# Patient Record
Sex: Male | Born: 1965 | Race: White | Hispanic: No | Smoking: Current some day smoker
Health system: Southern US, Community
[De-identification: ages and names within clinical notes are randomized; demographics above are authoritative.]

## PROBLEM LIST (undated history)

## (undated) DIAGNOSIS — F1910 Other psychoactive substance abuse, uncomplicated: Secondary | ICD-10-CM

## (undated) DIAGNOSIS — F1990 Other psychoactive substance use, unspecified, uncomplicated: Secondary | ICD-10-CM

## (undated) DIAGNOSIS — F1120 Opioid dependence, uncomplicated: Secondary | ICD-10-CM

## (undated) HISTORY — PX: FRACTURE SURGERY: SHX138

---

## 1998-05-24 ENCOUNTER — Emergency Department (HOSPITAL_COMMUNITY): Admission: EM | Admit: 1998-05-24 | Discharge: 1998-05-24 | Payer: Self-pay | Admitting: Internal Medicine

## 2002-01-30 ENCOUNTER — Emergency Department (HOSPITAL_COMMUNITY): Admission: EM | Admit: 2002-01-30 | Discharge: 2002-01-30 | Payer: Self-pay | Admitting: Emergency Medicine

## 2004-07-20 ENCOUNTER — Emergency Department (HOSPITAL_COMMUNITY): Admission: EM | Admit: 2004-07-20 | Discharge: 2004-07-20 | Payer: Self-pay | Admitting: Family Medicine

## 2004-09-18 ENCOUNTER — Emergency Department (HOSPITAL_COMMUNITY): Admission: EM | Admit: 2004-09-18 | Discharge: 2004-09-18 | Payer: Self-pay | Admitting: Emergency Medicine

## 2005-03-05 ENCOUNTER — Emergency Department (HOSPITAL_COMMUNITY): Admission: EM | Admit: 2005-03-05 | Discharge: 2005-03-05 | Payer: Self-pay | Admitting: Family Medicine

## 2006-05-31 ENCOUNTER — Emergency Department (HOSPITAL_COMMUNITY): Admission: EM | Admit: 2006-05-31 | Discharge: 2006-05-31 | Payer: Self-pay | Admitting: Family Medicine

## 2006-09-13 ENCOUNTER — Emergency Department (HOSPITAL_COMMUNITY): Admission: EM | Admit: 2006-09-13 | Discharge: 2006-09-13 | Payer: Self-pay | Admitting: Family Medicine

## 2006-10-30 ENCOUNTER — Emergency Department (HOSPITAL_COMMUNITY): Admission: EM | Admit: 2006-10-30 | Discharge: 2006-10-31 | Payer: Self-pay | Admitting: Emergency Medicine

## 2006-10-31 ENCOUNTER — Emergency Department (HOSPITAL_COMMUNITY): Admission: EM | Admit: 2006-10-31 | Discharge: 2006-10-31 | Payer: Self-pay | Admitting: Family Medicine

## 2010-12-18 HISTORY — PX: ANKLE FRACTURE SURGERY: SHX122

## 2013-09-01 ENCOUNTER — Encounter: Payer: Self-pay | Admitting: Family Medicine

## 2013-09-10 ENCOUNTER — Encounter: Payer: Self-pay | Admitting: Family Medicine

## 2013-09-10 ENCOUNTER — Ambulatory Visit (INDEPENDENT_AMBULATORY_CARE_PROVIDER_SITE_OTHER): Payer: Medicaid Other

## 2013-09-10 ENCOUNTER — Ambulatory Visit (INDEPENDENT_AMBULATORY_CARE_PROVIDER_SITE_OTHER): Payer: Medicaid Other | Admitting: Family Medicine

## 2013-09-10 VITALS — BP 127/77 | HR 77 | Temp 97.2°F | Ht 69.0 in | Wt 184.0 lb

## 2013-09-10 DIAGNOSIS — M79674 Pain in right toe(s): Secondary | ICD-10-CM

## 2013-09-10 DIAGNOSIS — M25511 Pain in right shoulder: Secondary | ICD-10-CM

## 2013-09-10 DIAGNOSIS — M79644 Pain in right finger(s): Secondary | ICD-10-CM

## 2013-09-10 DIAGNOSIS — M25519 Pain in unspecified shoulder: Secondary | ICD-10-CM

## 2013-09-10 DIAGNOSIS — M79609 Pain in unspecified limb: Secondary | ICD-10-CM

## 2013-09-10 NOTE — Patient Instructions (Addendum)
Smoking Cessation Quitting smoking is important to your health and has many advantages. However, it is not always easy to quit since nicotine is a very addictive drug. Often times, people try 3 times or more before being able to quit. This document explains the best ways for you to prepare to quit smoking. Quitting takes hard work and a lot of effort, but you can do it. ADVANTAGES OF QUITTING SMOKING  You will live longer, feel better, and live better.  Your body will feel the impact of quitting smoking almost immediately.  Within 20 minutes, blood pressure decreases. Your pulse returns to its normal level.  After 8 hours, carbon monoxide levels in the blood return to normal. Your oxygen level increases.  After 24 hours, the chance of having a heart attack starts to decrease. Your breath, hair, and body stop smelling like smoke.  After 48 hours, damaged nerve endings begin to recover. Your sense of taste and smell improve.  After 72 hours, the body is virtually free of nicotine. Your bronchial tubes relax and breathing becomes easier.  After 2 to 12 weeks, lungs can hold more air. Exercise becomes easier and circulation improves.  The risk of having a heart attack, stroke, cancer, or lung disease is greatly reduced.  After 1 year, the risk of coronary heart disease is cut in half.  After 5 years, the risk of stroke falls to the same as a nonsmoker.  After 10 years, the risk of lung cancer is cut in half and the risk of other cancers decreases significantly.  After 15 years, the risk of coronary heart disease drops, usually to the level of a nonsmoker.  If you are pregnant, quitting smoking will improve your chances of having a healthy baby.  The people you live with, especially any children, will be healthier.  You will have extra money to spend on things other than cigarettes. QUESTIONS TO THINK ABOUT BEFORE ATTEMPTING TO QUIT You may want to talk about your answers with your  caregiver.  Why do you want to quit?  If you tried to quit in the past, what helped and what did not?  What will be the most difficult situations for you after you quit? How will you plan to handle them?  Who can help you through the tough times? Your family? Friends? A caregiver?  What pleasures do you get from smoking? What ways can you still get pleasure if you quit? Here are some questions to ask your caregiver:  How can you help me to be successful at quitting?  What medicine do you think would be best for me and how should I take it?  What should I do if I need more help?  What is smoking withdrawal like? How can I get information on withdrawal? GET READY  Set a quit date.  Change your environment by getting rid of all cigarettes, ashtrays, matches, and lighters in your home, car, or work. Do not let people smoke in your home.  Review your past attempts to quit. Think about what worked and what did not. GET SUPPORT AND ENCOURAGEMENT You have a better chance of being successful if you have help. You can get support in many ways.  Tell your family, friends, and co-workers that you are going to quit and need their support. Ask them not to smoke around you.  Get individual, group, or telephone counseling and support. Programs are available at local hospitals and health centers. Call your local health department for   information about programs in your area.  Spiritual beliefs and practices may help some smokers quit.  Download a "quit meter" on your computer to keep track of quit statistics, such as how long you have gone without smoking, cigarettes not smoked, and money saved.  Get a self-help book about quitting smoking and staying off of tobacco. LEARN NEW SKILLS AND BEHAVIORS  Distract yourself from urges to smoke. Talk to someone, go for a walk, or occupy your time with a task.  Change your normal routine. Take a different route to work. Drink tea instead of coffee.  Eat breakfast in a different place.  Reduce your stress. Take a hot bath, exercise, or read a book.  Plan something enjoyable to do every day. Reward yourself for not smoking.  Explore interactive web-based programs that specialize in helping you quit. GET MEDICINE AND USE IT CORRECTLY Medicines can help you stop smoking and decrease the urge to smoke. Combining medicine with the above behavioral methods and support can greatly increase your chances of successfully quitting smoking.  Nicotine replacement therapy helps deliver nicotine to your body without the negative effects and risks of smoking. Nicotine replacement therapy includes nicotine gum, lozenges, inhalers, nasal sprays, and skin patches. Some may be available over-the-counter and others require a prescription.  Antidepressant medicine helps people abstain from smoking, but how this works is unknown. This medicine is available by prescription.  Nicotinic receptor partial agonist medicine simulates the effect of nicotine in your brain. This medicine is available by prescription. Ask your caregiver for advice about which medicines to use and how to use them based on your health history. Your caregiver will tell you what side effects to look out for if you choose to be on a medicine or therapy. Carefully read the information on the package. Do not use any other product containing nicotine while using a nicotine replacement product.  RELAPSE OR DIFFICULT SITUATIONS Most relapses occur within the first 3 months after quitting. Do not be discouraged if you start smoking again. Remember, most people try several times before finally quitting. You may have symptoms of withdrawal because your body is used to nicotine. You may crave cigarettes, be irritable, feel very hungry, cough often, get headaches, or have difficulty concentrating. The withdrawal symptoms are only temporary. They are strongest when you first quit, but they will go away within  10 14 days. To reduce the chances of relapse, try to:  Avoid drinking alcohol. Drinking lowers your chances of successfully quitting.  Reduce the amount of caffeine you consume. Once you quit smoking, the amount of caffeine in your body increases and can give you symptoms, such as a rapid heartbeat, sweating, and anxiety.  Avoid smokers because they can make you want to smoke.  Do not let weight gain distract you. Many smokers will gain weight when they quit, usually less than 10 pounds. Eat a healthy diet and stay active. You can always lose the weight gained after you quit.  Find ways to improve your mood other than smoking. FOR MORE INFORMATION  www.smokefree.gov  Document Released: 11/28/2001 Document Revised: 06/04/2012 Document Reviewed: 03/14/2012 [employer] Patient Information 2014 [employer].  

## 2013-09-10 NOTE — Progress Notes (Signed)
  Subjective:    Patient ID: Christopher [name], male    DOB: 05/21/1966, 47 y.o.   MRN: [id number]  HPI This 47 y.o. male presents for evaluation of polyarthralgias.  He is having right shoulder pain  And left index finger pain, and right first toe pain.  He states he has been taking a lot of aleve and ibuprofen and it is not working.  He states he has taken hydrocodone in the past and it made him Sick.   Review of Systems C/o polyarthralgias. No chest pain, SOB, HA, dizziness, vision change, N/V, diarrhea, constipation, dysuria, urinary urgency or frequency, myalgias, arthralgias or rash.     Objective:   Physical Exam Vital signs noted  Well developed well nourished male.  HEENT - Head atraumatic Normocephalic                Eyes - PERRLA, Conjuctiva - clear Sclera- Clear EOMI Respiratory - Lungs CTA bilateral Cardiac - RRR S1 and S2 without murmur MS - TTP right should and will not left me do rom because of pain although when he was showing me his elbow he was lifting his right arm up.  TTP left index finger and TTP right first toe with halux valgus.  Xray of left index finger is normal. Xray of right first toe with chronic degenerative changes and halux valgus. Xray of right shoulder normal Prelimnary reading by Chrissie Noa Etty Isaac,FNP      Assessment & Plan:  Right shoulder pain - Plan: DG Shoulder Right  Toe pain, right - Plan: DG Toe Great Right  Finger pain, right - Plan: DG Finger Index Right  Discussed with patient that his pain is from arthritis and if not better to follow up Called in Tramdol 50mg  one to two po qid prn pain #30 no RF and medrol dose pack as directed. Explained and discussed xrays and informed patient that if not better may have to see orthopedics.  Follow up prn.

## 2014-12-09 ENCOUNTER — Encounter (HOSPITAL_COMMUNITY): Payer: Self-pay | Admitting: *Deleted

## 2014-12-09 ENCOUNTER — Inpatient Hospital Stay (HOSPITAL_COMMUNITY)
Admission: EM | Admit: 2014-12-09 | Discharge: 2014-12-15 | DRG: 854 | Disposition: A | Discharge status: Home / Self Care | Payer: Medicaid Other | Attending: Internal Medicine | Admitting: Internal Medicine

## 2014-12-09 DIAGNOSIS — F17210 Nicotine dependence, cigarettes, uncomplicated: Secondary | ICD-10-CM | POA: Diagnosis present

## 2014-12-09 DIAGNOSIS — R739 Hyperglycemia, unspecified: Secondary | ICD-10-CM | POA: Diagnosis present

## 2014-12-09 DIAGNOSIS — B999 Unspecified infectious disease: Secondary | ICD-10-CM

## 2014-12-09 DIAGNOSIS — B9689 Other specified bacterial agents as the cause of diseases classified elsewhere: Secondary | ICD-10-CM | POA: Diagnosis present

## 2014-12-09 DIAGNOSIS — F1990 Other psychoactive substance use, unspecified, uncomplicated: Secondary | ICD-10-CM | POA: Diagnosis present

## 2014-12-09 DIAGNOSIS — M795 Residual foreign body in soft tissue: Secondary | ICD-10-CM

## 2014-12-09 DIAGNOSIS — M60009 Infective myositis, unspecified site: Secondary | ICD-10-CM | POA: Diagnosis present

## 2014-12-09 DIAGNOSIS — M60000 Infective myositis, unspecified right arm: Secondary | ICD-10-CM | POA: Diagnosis present

## 2014-12-09 DIAGNOSIS — E871 Hypo-osmolality and hyponatremia: Secondary | ICD-10-CM | POA: Diagnosis present

## 2014-12-09 DIAGNOSIS — F1910 Other psychoactive substance abuse, uncomplicated: Secondary | ICD-10-CM

## 2014-12-09 DIAGNOSIS — L03113 Cellulitis of right upper limb: Secondary | ICD-10-CM | POA: Diagnosis present

## 2014-12-09 DIAGNOSIS — A419 Sepsis, unspecified organism: Principal | ICD-10-CM | POA: Diagnosis present

## 2014-12-09 DIAGNOSIS — L02413 Cutaneous abscess of right upper limb: Secondary | ICD-10-CM | POA: Diagnosis present

## 2014-12-09 HISTORY — DX: Other psychoactive substance use, unspecified, uncomplicated: F19.90

## 2014-12-09 LAB — COMPREHENSIVE METABOLIC PANEL
ALBUMIN: 3.4 g/dL — AB (ref 3.5–5.2)
ALT: 40 U/L (ref 0–53)
ANION GAP: 10 (ref 5–15)
AST: 41 U/L — AB (ref 0–37)
Alkaline Phosphatase: 113 U/L (ref 39–117)
BILIRUBIN TOTAL: 0.8 mg/dL (ref 0.3–1.2)
BUN: 10 mg/dL (ref 6–23)
CHLORIDE: 97 meq/L (ref 96–112)
CO2: 24 mmol/L (ref 19–32)
Calcium: 8.9 mg/dL (ref 8.4–10.5)
Creatinine, Ser: 1.06 mg/dL (ref 0.50–1.35)
GFR calc non Af Amer: 82 mL/min — ABNORMAL LOW (ref >90)
Glucose, Bld: 156 mg/dL — ABNORMAL HIGH (ref 70–99)
Potassium: 4.2 mmol/L (ref 3.5–5.1)
SODIUM: 131 mmol/L — AB (ref 135–145)
Total Protein: 7.2 g/dL (ref 6.0–8.3)

## 2014-12-09 LAB — CBC WITH DIFFERENTIAL/PLATELET
Basophils Absolute: 0 10*3/uL (ref 0.0–0.1)
Basophils Relative: 0 % (ref 0–1)
EOS PCT: 0 % (ref 0–5)
Eosinophils Absolute: 0 10*3/uL (ref 0.0–0.7)
HEMATOCRIT: 43.1 % (ref 39.0–52.0)
Hemoglobin: 14.5 g/dL (ref 13.0–17.0)
LYMPHS ABS: 1.6 10*3/uL (ref 0.7–4.0)
LYMPHS PCT: 7 % — AB (ref 12–46)
MCH: 29.8 pg (ref 26.0–34.0)
MCHC: 33.6 g/dL (ref 30.0–36.0)
MCV: 88.7 fL (ref 78.0–100.0)
MONOS PCT: 7 % (ref 3–12)
Monocytes Absolute: 1.4 10*3/uL — ABNORMAL HIGH (ref 0.1–1.0)
NEUTROS PCT: 86 % — AB (ref 43–77)
Neutro Abs: 18.4 10*3/uL — ABNORMAL HIGH (ref 1.7–7.7)
Platelets: 312 10*3/uL (ref 150–400)
RBC: 4.86 MIL/uL (ref 4.22–5.81)
RDW: 13.4 % (ref 11.5–15.5)
WBC: 21.5 10*3/uL — ABNORMAL HIGH (ref 4.0–10.5)

## 2014-12-09 LAB — I-STAT CG4 LACTIC ACID, ED: LACTIC ACID, VENOUS: 1.88 mmol/L (ref 0.5–2.2)

## 2014-12-09 LAB — SEDIMENTATION RATE: SED RATE: 15 mm/h (ref 0–16)

## 2014-12-09 MED ORDER — PIPERACILLIN-TAZOBACTAM 3.375 G IVPB 30 MIN
3.3750 g | Freq: Once | INTRAVENOUS | Status: AC
  Administered 2014-12-10: 3.375 g via INTRAVENOUS
  Filled 2014-12-09: qty 50

## 2014-12-09 MED ORDER — ONDANSETRON HCL 4 MG/2ML IJ SOLN
4.0000 mg | Freq: Once | INTRAMUSCULAR | Status: AC
  Administered 2014-12-09: 4 mg via INTRAVENOUS
  Filled 2014-12-09: qty 2

## 2014-12-09 MED ORDER — VANCOMYCIN HCL IN DEXTROSE 1-5 GM/200ML-% IV SOLN
1000.0000 mg | Freq: Once | INTRAVENOUS | Status: AC
  Administered 2014-12-09: 1000 mg via INTRAVENOUS
  Filled 2014-12-09: qty 200

## 2014-12-09 MED ORDER — HYDROMORPHONE HCL 1 MG/ML IJ SOLN
1.0000 mg | Freq: Once | INTRAMUSCULAR | Status: AC
  Administered 2014-12-09: 1 mg via INTRAVENOUS
  Filled 2014-12-09: qty 1
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
## 2014-12-10 ENCOUNTER — Inpatient Hospital Stay (HOSPITAL_COMMUNITY): Payer: Medicaid Other | Admitting: Anesthesiology

## 2014-12-10 ENCOUNTER — Encounter (HOSPITAL_COMMUNITY)
Admission: EM | Disposition: A | Discharge status: Home / Self Care | Payer: Self-pay | Source: Home / Self Care | Attending: Internal Medicine

## 2014-12-10 ENCOUNTER — Emergency Department (HOSPITAL_COMMUNITY): Payer: Medicaid Other

## 2014-12-10 ENCOUNTER — Encounter (HOSPITAL_COMMUNITY): Payer: Self-pay | Admitting: Anesthesiology

## 2014-12-10 ENCOUNTER — Other Ambulatory Visit: Payer: Self-pay | Admitting: Orthopedic Surgery

## 2014-12-10 DIAGNOSIS — L02413 Cutaneous abscess of right upper limb: Secondary | ICD-10-CM | POA: Diagnosis present

## 2014-12-10 DIAGNOSIS — R739 Hyperglycemia, unspecified: Secondary | ICD-10-CM | POA: Diagnosis present

## 2014-12-10 DIAGNOSIS — A419 Sepsis, unspecified organism: Secondary | ICD-10-CM | POA: Diagnosis present

## 2014-12-10 DIAGNOSIS — M60000 Infective myositis, unspecified right arm: Secondary | ICD-10-CM | POA: Diagnosis present

## 2014-12-10 DIAGNOSIS — E871 Hypo-osmolality and hyponatremia: Secondary | ICD-10-CM | POA: Diagnosis present

## 2014-12-10 DIAGNOSIS — L03113 Cellulitis of right upper limb: Secondary | ICD-10-CM | POA: Diagnosis present

## 2014-12-10 DIAGNOSIS — F17210 Nicotine dependence, cigarettes, uncomplicated: Secondary | ICD-10-CM | POA: Diagnosis present

## 2014-12-10 DIAGNOSIS — B9689 Other specified bacterial agents as the cause of diseases classified elsewhere: Secondary | ICD-10-CM | POA: Diagnosis present

## 2014-12-10 DIAGNOSIS — F1990 Other psychoactive substance use, unspecified, uncomplicated: Secondary | ICD-10-CM | POA: Diagnosis present

## 2014-12-10 DIAGNOSIS — M79602 Pain in left arm: Secondary | ICD-10-CM | POA: Diagnosis present

## 2014-12-10 HISTORY — PX: INCISION AND DRAINAGE ABSCESS: SHX5864

## 2014-12-10 HISTORY — PX: I&D EXTREMITY: SHX5045

## 2014-12-10 LAB — HEMOGLOBIN A1C
HEMOGLOBIN A1C: 5.5 % (ref <5.7)
Mean Plasma Glucose: 111 mg/dL (ref <117)

## 2014-12-10 LAB — SURGICAL PCR SCREEN
MRSA, PCR: NEGATIVE
STAPHYLOCOCCUS AUREUS: NEGATIVE

## 2014-12-10 LAB — GRAM STAIN

## 2014-12-10 LAB — GLUCOSE, CAPILLARY
GLUCOSE-CAPILLARY: 103 mg/dL — AB (ref 70–99)
GLUCOSE-CAPILLARY: 123 mg/dL — AB (ref 70–99)
Glucose-Capillary: 131 mg/dL — ABNORMAL HIGH (ref 70–99)
Glucose-Capillary: 119 mg/dL — ABNORMAL HIGH (ref 70–99)

## 2014-12-10 LAB — CBC
HCT: 40.5 % (ref 39.0–52.0)
HEMOGLOBIN: 13.5 g/dL (ref 13.0–17.0)
MCH: 29.5 pg (ref 26.0–34.0)
MCHC: 33.3 g/dL (ref 30.0–36.0)
MCV: 88.6 fL (ref 78.0–100.0)
PLATELETS: 288 10*3/uL (ref 150–400)
RBC: 4.57 MIL/uL (ref 4.22–5.81)
RDW: 13.2 % (ref 11.5–15.5)
WBC: 20.3 10*3/uL — ABNORMAL HIGH (ref 4.0–10.5)

## 2014-12-10 LAB — CREATININE, SERUM
Creatinine, Ser: 1 mg/dL (ref 0.50–1.35)
GFR calc Af Amer: >90 mL/min (ref >90)
GFR, EST NON AFRICAN AMERICAN: 88 mL/min — AB (ref >90)

## 2014-12-10 SURGERY — IRRIGATION AND DEBRIDEMENT EXTREMITY
Anesthesia: General | Site: Arm Lower | Laterality: Right

## 2014-12-10 MED ORDER — PIPERACILLIN-TAZOBACTAM 3.375 G IVPB
3.3750 g | Freq: Three times a day (TID) | INTRAVENOUS | Status: DC
  Administered 2014-12-10 – 2014-12-15 (×15): 3.375 g via INTRAVENOUS
  Filled 2014-12-10 (×17): qty 50

## 2014-12-10 MED ORDER — HYDROMORPHONE HCL 1 MG/ML IJ SOLN
0.5000 mg | Freq: Once | INTRAMUSCULAR | Status: AC
  Administered 2014-12-10: 0.5 mg via INTRAVENOUS

## 2014-12-10 MED ORDER — HYDROMORPHONE HCL 1 MG/ML IJ SOLN
1.0000 mg | Freq: Once | INTRAMUSCULAR | Status: AC
  Administered 2014-12-10: 1 mg via INTRAVENOUS
  Filled 2014-12-10: qty 1

## 2014-12-10 MED ORDER — FENTANYL CITRATE 0.05 MG/ML IJ SOLN
INTRAMUSCULAR | Status: AC
  Filled 2014-12-10: qty 5

## 2014-12-10 MED ORDER — HYDROMORPHONE HCL 1 MG/ML IJ SOLN
0.2500 mg | INTRAMUSCULAR | Status: DC | PRN
  Administered 2014-12-10 – 2014-12-12 (×6): 0.5 mg via INTRAVENOUS
  Filled 2014-12-10: qty 1

## 2014-12-10 MED ORDER — PIPERACILLIN-TAZOBACTAM 3.375 G IVPB 30 MIN
3.3750 g | Freq: Once | INTRAVENOUS | Status: DC
  Filled 2014-12-10: qty 50

## 2014-12-10 MED ORDER — VANCOMYCIN HCL IN DEXTROSE 1-5 GM/200ML-% IV SOLN
1000.0000 mg | Freq: Once | INTRAVENOUS | Status: DC
  Filled 2014-12-10: qty 200

## 2014-12-10 MED ORDER — ONDANSETRON HCL 4 MG PO TABS: 4.0000 mg | ORAL_TABLET | Freq: Four times a day (QID) | ORAL | Status: DC | PRN

## 2014-12-10 MED ORDER — HEPARIN SODIUM (PORCINE) 5000 UNIT/ML IJ SOLN
5000.0000 [IU] | Freq: Three times a day (TID) | INTRAMUSCULAR | Status: DC
  Administered 2014-12-12 – 2014-12-15 (×9): 5000 [IU] via SUBCUTANEOUS
  Filled 2014-12-10 (×16): qty 1

## 2014-12-10 MED ORDER — SODIUM CHLORIDE 0.9 % IJ SOLN
3.0000 mL | Freq: Two times a day (BID) | INTRAMUSCULAR | Status: DC
  Administered 2014-12-11 – 2014-12-15 (×3): 3 mL via INTRAVENOUS

## 2014-12-10 MED ORDER — ONDANSETRON HCL 4 MG/2ML IJ SOLN
INTRAMUSCULAR | Status: AC
  Filled 2014-12-10: qty 4

## 2014-12-10 MED ORDER — SODIUM CHLORIDE 0.9 % IV SOLN
250.0000 mL | INTRAVENOUS | Status: DC | PRN
  Administered 2014-12-12: 250 mL via INTRAVENOUS

## 2014-12-10 MED ORDER — LORAZEPAM 2 MG/ML IJ SOLN
1.0000 mg | Freq: Once | INTRAMUSCULAR | Status: AC
  Administered 2014-12-10: 1 mg via INTRAVENOUS
  Filled 2014-12-10: qty 1

## 2014-12-10 MED ORDER — HYDROMORPHONE HCL 1 MG/ML IJ SOLN
INTRAMUSCULAR | Status: AC
  Filled 2014-12-10: qty 1

## 2014-12-10 MED ORDER — LACTATED RINGERS IV SOLN
INTRAVENOUS | Status: DC
  Administered 2014-12-10: 13:00:00 via INTRAVENOUS

## 2014-12-10 MED ORDER — VANCOMYCIN HCL IN DEXTROSE 1-5 GM/200ML-% IV SOLN
1000.0000 mg | Freq: Two times a day (BID) | INTRAVENOUS | Status: DC
  Filled 2014-12-10 (×2): qty 200

## 2014-12-10 MED ORDER — CHLORHEXIDINE GLUCONATE 4 % EX LIQD
60.0000 mL | Freq: Once | CUTANEOUS | Status: AC
  Administered 2014-12-10: 4 via TOPICAL
  Filled 2014-12-10 (×2): qty 60

## 2014-12-10 MED ORDER — VANCOMYCIN HCL IN DEXTROSE 1-5 GM/200ML-% IV SOLN
1000.0000 mg | Freq: Two times a day (BID) | INTRAVENOUS | Status: DC
  Administered 2014-12-10 – 2014-12-15 (×9): 1000 mg via INTRAVENOUS
  Filled 2014-12-10 (×12): qty 200

## 2014-12-10 MED ORDER — ONDANSETRON HCL 4 MG/2ML IJ SOLN: 4.0000 mg | Freq: Once | INTRAMUSCULAR | Status: DC | PRN

## 2014-12-10 MED ORDER — LIDOCAINE HCL (CARDIAC) 20 MG/ML IV SOLN
INTRAVENOUS | Status: DC | PRN
  Administered 2014-12-10: 100 mg via INTRAVENOUS

## 2014-12-10 MED ORDER — LORAZEPAM 1 MG PO TABS
1.0000 mg | ORAL_TABLET | Freq: Once | ORAL | Status: AC
  Administered 2014-12-10: 1 mg via ORAL
  Filled 2014-12-10: qty 1

## 2014-12-10 MED ORDER — HYDROMORPHONE HCL 1 MG/ML IJ SOLN: 1.0000 mg | INTRAMUSCULAR | Status: DC | PRN

## 2014-12-10 MED ORDER — ONDANSETRON HCL 4 MG/2ML IJ SOLN: 4.0000 mg | Freq: Four times a day (QID) | INTRAMUSCULAR | Status: DC | PRN

## 2014-12-10 MED ORDER — SODIUM CHLORIDE 0.9 % IJ SOLN: 3.0000 mL | INTRAMUSCULAR | Status: DC | PRN

## 2014-12-10 MED ORDER — SODIUM CHLORIDE 0.9 % IR SOLN
Status: DC | PRN
  Administered 2014-12-10: 1000 mL

## 2014-12-10 MED ORDER — ONDANSETRON HCL 4 MG/2ML IJ SOLN
INTRAMUSCULAR | Status: DC | PRN
  Administered 2014-12-10: 4 mg via INTRAVENOUS

## 2014-12-10 MED ORDER — SODIUM CHLORIDE 0.9 % IR SOLN
Status: DC | PRN
  Administered 2014-12-10: 3000 mL

## 2014-12-10 MED ORDER — INSULIN ASPART 100 UNIT/ML ~~LOC~~ SOLN
0.0000 [IU] | Freq: Three times a day (TID) | SUBCUTANEOUS | Status: DC
  Administered 2014-12-10 – 2014-12-13 (×5): 1 [IU] via SUBCUTANEOUS

## 2014-12-10 MED ORDER — OXYCODONE HCL 5 MG PO TABS
5.0000 mg | ORAL_TABLET | ORAL | Status: DC | PRN
  Administered 2014-12-10 (×2): 5 mg via ORAL
  Filled 2014-12-10: qty 1

## 2014-12-10 MED ORDER — LACTATED RINGERS IV SOLN
INTRAVENOUS | Status: DC | PRN
  Administered 2014-12-10: 13:00:00 via INTRAVENOUS

## 2014-12-10 MED ORDER — PROPOFOL 10 MG/ML IV BOLUS
INTRAVENOUS | Status: DC | PRN
  Administered 2014-12-10: 200 mg via INTRAVENOUS

## 2014-12-10 MED ORDER — OXYCODONE HCL 5 MG PO TABS
ORAL_TABLET | ORAL | Status: AC
  Filled 2014-12-10: qty 1

## 2014-12-10 MED ORDER — MIDAZOLAM HCL 5 MG/5ML IJ SOLN
INTRAMUSCULAR | Status: DC | PRN
  Administered 2014-12-10: 2 mg via INTRAVENOUS

## 2014-12-10 MED ORDER — ACETAMINOPHEN 650 MG RE SUPP: 650.0000 mg | Freq: Four times a day (QID) | RECTAL | Status: DC | PRN

## 2014-12-10 MED ORDER — MIDAZOLAM HCL 2 MG/2ML IJ SOLN
INTRAMUSCULAR | Status: AC
  Filled 2014-12-10: qty 2

## 2014-12-10 MED ORDER — ACETAMINOPHEN 325 MG PO TABS
650.0000 mg | ORAL_TABLET | Freq: Four times a day (QID) | ORAL | Status: DC | PRN
Start: 2014-12-10 — End: 2014-12-15
  Administered 2014-12-10 – 2014-12-11 (×2): 650 mg via ORAL
  Filled 2014-12-10 (×2): qty 2

## 2014-12-10 MED ORDER — PIPERACILLIN-TAZOBACTAM 3.375 G IVPB
3.3750 g | Freq: Three times a day (TID) | INTRAVENOUS | Status: DC
Start: 2014-12-10 — End: 2014-12-10
  Filled 2014-12-10 (×3): qty 50

## 2014-12-10 MED ORDER — SODIUM CHLORIDE 0.9 % IV SOLN
INTRAVENOUS | Status: AC
  Administered 2014-12-10: 10:00:00 via INTRAVENOUS

## 2014-12-10 MED ORDER — OXYCODONE HCL 5 MG PO TABS
5.0000 mg | ORAL_TABLET | ORAL | Status: DC | PRN
  Administered 2014-12-11 – 2014-12-15 (×22): 10 mg via ORAL
  Filled 2014-12-10 (×23): qty 2

## 2014-12-10 MED ORDER — FENTANYL CITRATE 0.05 MG/ML IJ SOLN
INTRAMUSCULAR | Status: DC | PRN
  Administered 2014-12-10: 150 ug via INTRAVENOUS
  Administered 2014-12-10 (×2): 100 ug via INTRAVENOUS

## 2014-12-10 MED ORDER — IBUPROFEN 400 MG PO TABS
400.0000 mg | ORAL_TABLET | Freq: Four times a day (QID) | ORAL | Status: DC | PRN
  Administered 2014-12-10 – 2014-12-14 (×7): 400 mg via ORAL
  Filled 2014-12-10 (×10): qty 1

## 2014-12-10 SURGICAL SUPPLY — 51 items
BANDAGE ELASTIC 3 VELCRO ST LF (GAUZE/BANDAGES/DRESSINGS) IMPLANT
BANDAGE ELASTIC 4 VELCRO ST LF (GAUZE/BANDAGES/DRESSINGS) ×6 IMPLANT
BNDG CMPR 9X4 STRL LF SNTH (GAUZE/BANDAGES/DRESSINGS)
BNDG CONFORM 2 STRL LF (GAUZE/BANDAGES/DRESSINGS) IMPLANT
BNDG ESMARK 4X9 LF (GAUZE/BANDAGES/DRESSINGS) IMPLANT
BNDG GAUZE ELAST 4 BULKY (GAUZE/BANDAGES/DRESSINGS) ×3 IMPLANT
CORDS BIPOLAR (ELECTRODE) ×3 IMPLANT
COVER SURGICAL LIGHT HANDLE (MISCELLANEOUS) ×3 IMPLANT
CUFF TOURNIQUET SINGLE 18IN (TOURNIQUET CUFF) ×3 IMPLANT
DECANTER SPIKE VIAL GLASS SM (MISCELLANEOUS) IMPLANT
DRAIN PENROSE 1/4X12 LTX STRL (WOUND CARE) IMPLANT
DRAPE SURG 17X23 STRL (DRAPES) ×3 IMPLANT
DRSG PAD ABDOMINAL 8X10 ST (GAUZE/BANDAGES/DRESSINGS) ×3 IMPLANT
DURAPREP 26ML APPLICATOR (WOUND CARE) ×3 IMPLANT
ELECT REM PT RETURN 9FT ADLT (ELECTROSURGICAL) ×3
ELECTRODE REM PT RTRN 9FT ADLT (ELECTROSURGICAL) ×1 IMPLANT
GAUZE PACKING IODOFORM 1/4X5 (PACKING) IMPLANT
GAUZE SPONGE 4X4 12PLY STRL (GAUZE/BANDAGES/DRESSINGS) ×6 IMPLANT
GAUZE XEROFORM 1X8 LF (GAUZE/BANDAGES/DRESSINGS) IMPLANT
GAUZE XEROFORM 5X9 LF (GAUZE/BANDAGES/DRESSINGS) ×3 IMPLANT
GLOVE SURG SYN 8.0 (GLOVE) ×3 IMPLANT
GOWN STRL REUS W/ TWL LRG LVL3 (GOWN DISPOSABLE) ×1 IMPLANT
GOWN STRL REUS W/ TWL XL LVL3 (GOWN DISPOSABLE) ×1 IMPLANT
GOWN STRL REUS W/TWL LRG LVL3 (GOWN DISPOSABLE) ×3
GOWN STRL REUS W/TWL XL LVL3 (GOWN DISPOSABLE) ×3
HANDPIECE INTERPULSE COAX TIP (DISPOSABLE)
KIT BASIN OR (CUSTOM PROCEDURE TRAY) ×3 IMPLANT
KIT ROOM TURNOVER OR (KITS) ×3 IMPLANT
MANIFOLD NEPTUNE II (INSTRUMENTS) ×3 IMPLANT
NEEDLE HYPO 25GX1X1/2 BEV (NEEDLE) IMPLANT
NEEDLE HYPO 25X1 1.5 SAFETY (NEEDLE) ×3 IMPLANT
NS IRRIG 1000ML POUR BTL (IV SOLUTION) ×3 IMPLANT
PACK ORTHO EXTREMITY (CUSTOM PROCEDURE TRAY) ×3 IMPLANT
PAD ARMBOARD 7.5X6 YLW CONV (MISCELLANEOUS) ×3 IMPLANT
PAD CAST 4YDX4 CTTN HI CHSV (CAST SUPPLIES) ×1 IMPLANT
PADDING CAST COTTON 4X4 STRL (CAST SUPPLIES) ×3
SET HNDPC FAN SPRY TIP SCT (DISPOSABLE) IMPLANT
SPONGE LAP 18X18 X RAY DECT (DISPOSABLE) ×3 IMPLANT
SUCTION FRAZIER TIP 10 FR DISP (SUCTIONS) ×3 IMPLANT
SUT VICRYL RAPIDE 4/0 PS 2 (SUTURE) IMPLANT
SYR 20CC LL (SYRINGE) IMPLANT
SYR CONTROL 10ML LL (SYRINGE) IMPLANT
SYRINGE 20CC LL (MISCELLANEOUS) ×3 IMPLANT
TOWEL OR 17X24 6PK STRL BLUE (TOWEL DISPOSABLE) ×3 IMPLANT
TOWEL OR 17X26 10 PK STRL BLUE (TOWEL DISPOSABLE) ×3 IMPLANT
TUBE ANAEROBIC SPECIMEN COL (MISCELLANEOUS) ×3 IMPLANT
TUBE CONNECTING 12'X1/4 (SUCTIONS) ×1
TUBE CONNECTING 12X1/4 (SUCTIONS) ×2 IMPLANT
UNDERPAD 30X30 INCONTINENT (UNDERPADS AND DIAPERS) ×3 IMPLANT
WATER STERILE IRR 1000ML POUR (IV SOLUTION) IMPLANT
YANKAUER SUCT BULB TIP NO VENT (SUCTIONS) ×3 IMPLANT

## 2014-12-10 NOTE — ED Notes (Signed)
MRI aware patient will be medicated for next exam; 2 patients are ahead of patient; MD aware

## 2014-12-10 NOTE — Op Note (Signed)
[name]:  Christopher [name], Christopher [name]            ACCOUNT NO.:  [id number]  MEDICAL RECORD NO.:  [id number]  LOCATION:  [id number]                        FACILITY:  [facility]  PHYSICIAN:  Artist PaisMatthew A. Tyus Kallam, M.D.DATE OF BIRTH:  14-Oct-1966  DATE OF PROCEDURE:  12/10/2014 DATE OF DISCHARGE:                              OPERATIVE REPORT   PREOPERATIVE DIAGNOSIS:  Proximal forearm dorsal deep abscess.  POSTOPERATIVE DIAGNOSIS:  Proximal forearm dorsal deep abscess.  PROCEDURE:  Incision and drainage of the above.  SURGEON:  Artist PaisMatthew A. Mina MarbleWeingold, M.D.  ASSISTANT:  Betha LoaKevin Kuzma, M.D.  ANESTHESIA:  General.  COMPLICATIONS:  No complications.  DRAINS:  None.  SPECIMENS:  Wound packed, open cultures sent.  Gram stain sent.  DESCRIPTION OF PROCEDURE:  The patient was taken to the operating suite. After induction of adequate general anesthetic, right upper extremity was prepped and draped in usual sterile fashion.  An Esmarch was used to exsanguinate the limb.  Tourniquet was inflated to 250 mmHg.  At this point in time, incision was made over the dorsal radial aspect of the right forearm starting in the area of the epicondyle laterally going to the mid forearm level.  We incised the skin and the fascia overlying the interval between brachioradialis.  Extensor origin was incised. Purulence was encountered.  Culture for aerobic, anaerobic, and Gram stain.  We retracted the common extensor musculature and the brachioradialis, all the purulence was removed.  We then irrigated with 3 L of normal saline using cystoscopy tubing and then packed the wound open with 0.5 inch Iodoform gauze and Xeroform 4x4s and a compression wrap.  The patient tolerated the procedure well __________ fashion.     Artist PaisMatthew A. Mina MarbleWeingold, M.D.     MAW/MEDQ  D:  12/10/2014  T:  12/10/2014  Job:  [id number]

## 2014-12-10 NOTE — ED Notes (Signed)
Called to MRI for pt being unable to tolerate test. Given dilaudid, per dr Norlene Campbellotter order. On assessment of pt in MRI, pt lethargic but will open eyes.

## 2014-12-10 NOTE — H&P (Signed)
[facility] History and Physical  Christopher BussingRichard K [name] RUE:[id number] DOB: 11/01/1966 DOA: 12/09/2014  Referring physician: Dr. Romeo AppleHarrison PCP: Rudi HeapMOORE, [name], MD   Chief Complaint:  Right arm pain  HPI: Christopher BussingRichard K [name] is a 48 y.o. male  With recent history of IV drug use 2 days ago. Presents with pain at injection site. Since onset 2 days ago has progressively gotten worse. Nothing he is aware of makes it better. Movement or touching the site makes it more painful. The problem has been persistent since onset.   While in the ED patient had MRI of right arm which revealed abscess and cellulitis. Orthopedic hand specialist consulted. And we were consulted for further medical evaluation recommendations.   Review of Systems:  Constitutional:  No reported weight loss, night sweats, Fevers, chills, fatigue.  HEENT:  No headaches, Difficulty swallowing,Tooth/dental problems,Sore throat,  No sneezing, itching, ear ache, nasal congestion, post nasal drip,  Cardio-vascular:  No chest pain, Orthopnea, PND, swelling in lower extremities, anasarca, dizziness, palpitations  GI:  No heartburn, indigestion, abdominal pain, nausea, vomiting, diarrhea, change in bowel habits, loss of appetite  Resp:  No shortness of breath with exertion or at rest. No excess mucus, no productive cough, No non-productive cough, No coughing up of blood.No change in color of mucus.No wheezing.No chest wall deformity  Skin:  no rash or lesions.  GU:  no dysuria, change in color of urine, no urgency or frequency. No flank pain.  Musculoskeletal:  + joint pain or swelling.+ decreased range of motion. No back pain.  Psych:  No change in mood or affect. No depression or anxiety. No memory loss.   History reviewed. No pertinent past medical history. Past Surgical History  Procedure Laterality Date  . Fracture surgery Left 2012    ankle/foot   Social History:  reports that he has been smoking Cigarettes.  He has  been smoking about 0.00 packs per day. He does not have any smokeless tobacco history on file. He reports that he drinks alcohol. He reports that he does not use illicit drugs.  No Known Allergies  Family History  Problem Relation Age of Onset  . Liver disease Mother   . Heart disease Father      Prior to Admission medications   Medication Sig Start Date End Date Taking? Authorizing Provider  ibuprofen (ADVIL,MOTRIN) 200 MG tablet Take 400 mg by mouth every 6 (six) hours as needed for moderate pain.   Yes Historical Provider, MD   Physical Exam: Filed Vitals:   12/10/14 0800 12/10/14 0830 12/10/14 0900 12/10/14 0930  BP: 128/77 137/75 101/74 129/82  Pulse: 107 100 98 105  Temp:      TempSrc:      Resp:      SpO2: 96% 98% 98% 97%    Wt Readings from Last 3 Encounters:  12/10/14 83.462 kg (184 lb)  09/10/13 83.462 kg (184 lb)    General:  Appears calm and uncomfortable Eyes: PERRL, normal lids, irises & conjunctiva ENT: grossly normal hearing, lips & tongue Neck: no LAD, masses or thyromegaly Cardiovascular: RRR, no m/r/g. No LE edema. Respiratory: CTA bilaterally, no w/r/r. Normal respiratory effort. Abdomen: soft, nt,nd Skin: Right arm induration and cellulitis. Pain on palpation Musculoskeletal: Decreased range of motion in right arm. Cyanosis or clubbing Psychiatric: grossly normal mood and flat affect, speech fluent and appropriate Neurologic: Answers questions appropriately, no facial asymmetry          Labs on Admission:  Basic Metabolic Panel:  Recent Labs Lab 12/09/14 2218  NA 131*  K 4.2  CL 97  CO2 24  GLUCOSE 156*  BUN 10  CREATININE 1.06  CALCIUM 8.9   Liver Function Tests:  Recent Labs Lab 12/09/14 2218  AST 41*  ALT 40  ALKPHOS 113  BILITOT 0.8  PROT 7.2  ALBUMIN 3.4*   No results for input(s): LIPASE, AMYLASE in the last 168 hours. No results for input(s): AMMONIA in the last 168 hours. CBC:  Recent Labs Lab 12/09/14 2218    WBC 21.5*  NEUTROABS 18.4*  HGB 14.5  HCT 43.1  MCV 88.7  PLT 312   Cardiac Enzymes: No results for input(s): CKTOTAL, CKMB, CKMBINDEX, TROPONINI in the last 168 hours.  BNP (last 3 results) No results for input(s): PROBNP in the last 8760 hours. CBG: No results for input(s): GLUCAP in the last 168 hours.  Radiological Exams on Admission: Dg Eye Foreign Body  12/10/2014   CLINICAL DATA:  Metal working/exposure; clearance prior to MRI  EXAM: ORBITS FOR FOREIGN BODY - 2 VIEW  COMPARISON:  None.  FINDINGS: There is no evidence of metallic foreign body within the orbits. No significant bone abnormality identified.  IMPRESSION: No evidence of metallic foreign body within the orbits.   Electronically Signed   By: Christiana Pellant M.D.   On: 12/10/2014 01:56   Mr Forearm Right W/o Cm  12/10/2014   CLINICAL DATA:  Pt injected either "molly" or meth into his arm yesterday and since then is having severe pain, redness, and edema to rt arm. Pt attempted twice with pain meds, unable to complete full exam due to pain. Tried multiple positions to accommodate. Unable to scan humerus or give contrast.  EXAM: MRI OF THE RIGHT FOREARM WITHOUT CONTRAST  TECHNIQUE: Multiplanar, multisequence MR imaging was performed. No intravenous contrast was administered.  COMPARISON:  None.  FINDINGS: Limited examination as the examination could not be completed in its totality. There is also patient motion which degrades image quality.  There is severe muscle edema in the brachioradialis muscle and extensor carpi radialis longus muscle. There is a 3.5 x 7.1 x 6 cm fluid collection within the brachioradialis muscle. There is abnormal signal within the musculotendinous junction of the brachioradialis. There is severe soft tissue edema around the right elbow extending into the forearm to the level of the wrist. There are no low signal foci to suggest a tear.  The remainder of the muscle groups are normal in signal. There is no  marrow signal abnormality. No fracture or dislocation. There is no periostitis. There is no definite elbow joint effusion.  IMPRESSION: 1. Severe muscle edema in the brachioradialis muscle and extensor carpi the radialis longus muscle most consistent with myositis. 3.5 x 7.1 x 6 cm fluid collection within the brachioradialis muscle which may reflect muscle necrosis versus abscess. Circumferential edema around the entirety of the forearm most severe along the radial aspect most concerning for cellulitis. 2. Tendinosis versus partial tear of the brachioradialis tendon at the musculotendinous junction.   Electronically Signed   By: Elige Ko   On: 12/10/2014 08:40    Assessment/Plan Principal Problem:   Cellulitis of right arm - Monitor cbc - supportive therapy - Antibiotic administration  Active Problems:   Abscess of arm, right - Orthopaedic hand specialist consulted plans are for OR for further evaluation and treatment.    Hyperglycemia -Most be new problem given no history of medications at home. - Obtain hemoglobin A1c - Sliding scale  insulin every 4 hours while nothing by mouth and once taking by mouth a transition to every before meals and daily at bedtime    IV drug user - recommend cessation and consult Social worker.    Hyponatremia - Euvolemic hyponatremia as such suspect poor oral solute intake as cause - For now will monitor but should value trend down will plan on starting normal saline  Code Status:full code DVT Prophylaxis:Heparin Family Communication: discussed directly with patient Disposition Plan: To or then inpatient in med surg  Time spent: > 55 minutes  Penny PiaVEGA, Hazelynn Mckenny [facility] Pager [phone]

## 2014-12-10 NOTE — Anesthesia Preprocedure Evaluation (Signed)
Anesthesia Evaluation  Patient identified by MRN, date of birth, ID band Patient awake    Reviewed: Allergy & Precautions, H&P , NPO status , Patient's Chart, lab work & pertinent test results  Airway        Dental   Pulmonary Current Smoker,          Cardiovascular     Neuro/Psych    GI/Hepatic (+)     substance abuse  IV drug use,   Endo/Other    Renal/GU      Musculoskeletal   Abdominal   Peds  Hematology   Anesthesia Other Findings   Reproductive/Obstetrics                             Anesthesia Physical Anesthesia Plan  ASA: II  Anesthesia Plan: General   Post-op Pain Management:    Induction: Intravenous  Airway Management Planned: LMA and Oral ETT  Additional Equipment:   Intra-op Plan:   Post-operative Plan: Extubation in OR  Informed Consent: I have reviewed the patients History and Physical, chart, labs and discussed the procedure including the risks, benefits and alternatives for the proposed anesthesia with the patient or authorized representative who has indicated his/her understanding and acceptance.     Plan Discussed with: CRNA, Anesthesiologist and Surgeon  Anesthesia Plan Comments:         Anesthesia Quick Evaluation

## 2014-12-10 NOTE — Progress Notes (Signed)
Patient agitated and sweating, states the "pain medication is not working."  VS WNL.  On call T. Claiborne Billingsallahan, NP contacted.  One time order for 1mg  of Ativan po received and oxycodone 5-10mg  po ordered q4h prn.  Will continue to monitor patient closely.

## 2014-12-10 NOTE — Op Note (Signed)
See note [phone]

## 2014-12-10 NOTE — Progress Notes (Signed)
Gram stain shows gram positive cocci and gram negative rods

## 2014-12-10 NOTE — Progress Notes (Signed)
ANTIBIOTIC CONSULT NOTE - INITIAL  Pharmacy Consult for Vancomycin, Zosyn Indication: right arm abscess  No Known Allergies  Patient Measurements: Weight = 83 kg  Vital Signs: Temp: 98.9 F (37.2 C) (12/24 1047) BP: 126/76 mmHg (12/24 1047) Pulse Rate: 94 (12/24 1047)  Labs:  Recent Labs  12/09/14 2218  WBC 21.5*  HGB 14.5  PLT 312  CREATININE 1.06    Microbiology: No results found for this or any previous visit (from the past 720 hour(s)).  Medical History: History reviewed. No pertinent past medical history.  Assessment: 48 year old male with recent IV drug use.  Now presents with pain at injection site. Pharmacy asked to begin Vancomycin and Zosyn for cellulitis with plans for OR today.  Last doses of antibiotics last PM  Goal of Therapy:  Vancomycin trough level 10-15 mcg/ml  Appropriate Zosyn dosing  Plan:  Zosyn 3.375 grams iv Q 8 hours - 4 hr infusion Vancomycin 1 gram iv Q 12 hours Follow up progress, fever trend, renal trend  Thank you. Okey RegalLisa Tysha Grismore, PharmD [phone]  Christopher [name], Christopher [name] 12/10/2014,10:50 AM

## 2014-12-10 NOTE — ED Notes (Signed)
Patient transported to MRI 

## 2014-12-10 NOTE — ED Notes (Signed)
MRI unable to complete scans due to discomfort. Pt reports he cannot lay still long enough for scans.

## 2014-12-10 NOTE — Interval H&P Note (Signed)
History and Physical Interval Note:  12/10/2014 12:39 PM  Christopher [name]  has presented today for surgery, with the diagnosis of RIGHT ARM INFECTION  The various methods of treatment have been discussed with the patient and family. After consideration of risks, benefits and other options for treatment, the patient has consented to  Procedure(s): IRRIGATION AND DEBRIDEMENT EXTREMITY (Right) as a surgical intervention .  The patient's history has been reviewed, patient examined, no change in status, stable for surgery.  I have reviewed the patient's chart and labs.  Questions were answered to the patient's satisfaction.     Dairl PonderWEINGOLD,Vincenta Steffey A

## 2014-12-10 NOTE — Progress Notes (Signed)
Patient underwent I and D of proximal dorsal deep abscess with cultures and gramstain obtained   Wound packed open and will watch WBC   If normalized by Saturday will start bedside PT/Hydrotherapy   If WBC elevated will repeat I and D   Will follow with you

## 2014-12-10 NOTE — Transfer of Care (Signed)
Immediate Anesthesia Transfer of Care Note  Patient: Darrow BussingRichard K Leiner  Procedure(s) Performed: Procedure(s): IRRIGATION AND DEBRIDEMENT EXTREMITY (Right)  Patient Location: PACU  Anesthesia Type:General  Level of Consciousness: awake, alert  and oriented  Airway & Oxygen Therapy: Patient Spontanous Breathing and Patient connected to nasal cannula oxygen  Post-op Assessment: Report given to PACU RN and Post -op Vital signs reviewed and stable  Post vital signs: Reviewed and stable  Complications: No apparent anesthesia complications

## 2014-12-10 NOTE — Anesthesia Postprocedure Evaluation (Signed)
  Anesthesia Post-op Note  Patient: Christopher BussingRichard K Trimble  Procedure(s) Performed: Procedure(s): IRRIGATION AND DEBRIDEMENT EXTREMITY (Right)  Patient Location: PACU  Anesthesia Type:General  Level of Consciousness: awake, alert , oriented and patient cooperative  Airway and Oxygen Therapy: Patient Spontanous Breathing  Post-op Pain: mild  Post-op Assessment: Post-op Vital signs reviewed, Patient's Cardiovascular Status Stable, Respiratory Function Stable, Patent Airway, No signs of Nausea or vomiting and Pain level controlled  Post-op Vital Signs: stable  Last Vitals:  Filed Vitals:   12/10/14 1354  BP: 110/54  Pulse: 90  Temp: 37.1 C  Resp: 20    Complications: No apparent anesthesia complications

## 2014-12-10 NOTE — Progress Notes (Signed)
Utilization review completed. Jermario Kalmar, RN, BSN. 

## 2014-12-10 NOTE — ED Provider Notes (Addendum)
Care assumed at change of shift from Dr Effie ShyWentz.  Pt with IVDU, most recent 2 days ago with swelling, pain to right lower arm/elbow.  Awaiting MRI.  Pt unable to tolerate initially due to anxiety, claustrophobia, then due to pain.  Pt has been medicated further, and will be premedicated prior to MRI, but now is 3rd in line for MRI.  Olivia Mackielga M Iyan Flett, MD 12/10/14 [id number]          Olivia Mackielga M Copper Kirtley, MD 12/10/14 [phone]

## 2014-12-10 NOTE — ED Provider Notes (Signed)
Accepted care from Dr. Norlene Campbelltter. MRI shows abscess. Discussed w/ Dr. Mina MarbleWeingold who recommended making the patient nothing by mouth and admitting to medicine for IV antibiotics. Plan to take the patient to the OR later today.  Clinical Impression 1. Abscess of right arm   2. Infection   3. Foreign body (FB) in soft tissue   4. IV drug abuse      Christopher SheffieldForrest Princella Jaskiewicz, MD 12/10/14 [phone]

## 2014-12-11 DIAGNOSIS — L02413 Cutaneous abscess of right upper limb: Secondary | ICD-10-CM

## 2014-12-11 DIAGNOSIS — F1990 Other psychoactive substance use, unspecified, uncomplicated: Secondary | ICD-10-CM

## 2014-12-11 DIAGNOSIS — R739 Hyperglycemia, unspecified: Secondary | ICD-10-CM

## 2014-12-11 DIAGNOSIS — E871 Hypo-osmolality and hyponatremia: Secondary | ICD-10-CM

## 2014-12-11 LAB — CBC
HCT: 39.5 % (ref 39.0–52.0)
Hemoglobin: 12.9 g/dL — ABNORMAL LOW (ref 13.0–17.0)
MCH: 29.1 pg (ref 26.0–34.0)
MCHC: 32.7 g/dL (ref 30.0–36.0)
MCV: 89 fL (ref 78.0–100.0)
Platelets: 284 10*3/uL (ref 150–400)
RBC: 4.44 MIL/uL (ref 4.22–5.81)
RDW: 12.9 % (ref 11.5–15.5)
WBC: 17.5 10*3/uL — ABNORMAL HIGH (ref 4.0–10.5)

## 2014-12-11 LAB — HIV ANTIBODY (ROUTINE TESTING W REFLEX): HIV 1&2 Ab, 4th Generation: NONREACTIVE

## 2014-12-11 LAB — BASIC METABOLIC PANEL
Anion gap: 6 (ref 5–15)
BUN: 12 mg/dL (ref 6–23)
CO2: 27 mmol/L (ref 19–32)
Calcium: 8.3 mg/dL — ABNORMAL LOW (ref 8.4–10.5)
Chloride: 104 mEq/L (ref 96–112)
Creatinine, Ser: 1.1 mg/dL (ref 0.50–1.35)
GFR calc Af Amer: >90 mL/min (ref >90)
GFR calc non Af Amer: 78 mL/min — ABNORMAL LOW (ref >90)
Glucose, Bld: 149 mg/dL — ABNORMAL HIGH (ref 70–99)
Potassium: 4 mmol/L (ref 3.5–5.1)
Sodium: 137 mmol/L (ref 135–145)

## 2014-12-11 LAB — GLUCOSE, CAPILLARY
GLUCOSE-CAPILLARY: 136 mg/dL — AB (ref 70–99)
Glucose-Capillary: 150 mg/dL — ABNORMAL HIGH (ref 70–99)
Glucose-Capillary: 114 mg/dL — ABNORMAL HIGH (ref 70–99)
Glucose-Capillary: 125 mg/dL — ABNORMAL HIGH (ref 70–99)

## 2014-12-11 MED ORDER — NICOTINE 21 MG/24HR TD PT24
21.0000 mg | MEDICATED_PATCH | Freq: Every day | TRANSDERMAL | Status: DC
  Administered 2014-12-11 – 2014-12-15 (×4): 21 mg via TRANSDERMAL
  Filled 2014-12-11 (×5): qty 1

## 2014-12-11 NOTE — Progress Notes (Signed)
Patient seen at bedside this PM  Have discussed repeat I and D tomorrow   Will proceed and follow WBC until normalizes

## 2014-12-11 NOTE — Progress Notes (Signed)
[facility] PROGRESS NOTE   Christopher BussingRichard K [name] [id number] DOB: 01/31/1966 DOA: 12/09/2014 PCP: Rudi HeapMOORE, [name], MD  HPI/Subjective: Feels much better, less pain and no fever since yesterday.  Assessment/Plan: Principal Problem:   Cellulitis of right arm Active Problems:   Abscess of arm, right   Hyperglycemia   IV drug user   Hyponatremia   Right forearm deep tissue abscess with pyomyositis Secondary to cellulitis and abscess and resultant pyomyositis. This is started after self injection with IV drugs. Status post I&D done by Dr. Mina MarbleWeingold of orthopedics, recommends tocontinue IV antibiotics. Gram stain is showing polymicrobial infection with GPC in clusters and pairs and GNRs.  Right arm cellulitis/pyomyositis Continue IV antibiotics.  Hyperglycemia Blood sugar was up to 156, is likely secondary to stress reaction from infection. A1c is 5.5, no indication for hypoglycemic agents.  Sepsis Early sepsis with WBC of 21.5, heart rate of 102 and presence of right upper extremity cellulitis. Patient hydrated with IV fluids, continue IV antibiotics.  IV drug use Patient counseled extensively.  Code Status: Full code Family Communication: Plan discussed with the patient. Disposition Plan: Remains inpatient   Consultants:  Orthopedics  Procedures:  Incision and drainage of proximal forearm dorsal deep abscess done by Dr. Mina MarbleWeingold. On 12/10/2014.  Antibiotics:  Zosyn and vancomycin   Objective: Filed Vitals:   12/11/14 0422  BP: 132/78  Pulse: 88  Temp: 98.4 F (36.9 C)  Resp: 16    Intake/Output Summary (Last 24 hours) at 12/11/14 1112 Last data filed at 12/10/14 1700  Gross per 24 hour  Intake    490 ml  Output    550 ml  Net    -60 ml   There were no vitals filed for this visit.  Exam: General: Alert and awake, oriented x3, not in any acute distress. HEENT: anicteric sclera, pupils reactive to light and accommodation, EOMI CVS: S1-S2  clear, no murmur rubs or gallops Chest: clear to auscultation bilaterally, no wheezing, rales or rhonchi Abdomen: soft nontender, nondistended, normal bowel sounds, no organomegaly Extremities: no cyanosis, clubbing or edema noted bilaterally Neuro: Cranial nerves II-XII intact, no focal neurological deficits  Data Reviewed: Basic Metabolic Panel:  Recent Labs Lab 12/09/14 2218 12/10/14 1120 12/11/14 0523  NA 131*  --  137  K 4.2  --  4.0  CL 97  --  104  CO2 24  --  27  GLUCOSE 156*  --  149*  BUN 10  --  12  CREATININE 1.06 1.00 1.10  CALCIUM 8.9  --  8.3*   Liver Function Tests:  Recent Labs Lab 12/09/14 2218  AST 41*  ALT 40  ALKPHOS 113  BILITOT 0.8  PROT 7.2  ALBUMIN 3.4*   No results for input(s): LIPASE, AMYLASE in the last 168 hours. No results for input(s): AMMONIA in the last 168 hours. CBC:  Recent Labs Lab 12/09/14 2218 12/10/14 1120 12/11/14 0523  WBC 21.5* 20.3* 17.5*  NEUTROABS 18.4*  --   --   HGB 14.5 13.5 12.9*  HCT 43.1 40.5 39.5  MCV 88.7 88.6 89.0  PLT 312 288 284   Cardiac Enzymes: No results for input(s): CKTOTAL, CKMB, CKMBINDEX, TROPONINI in the last 168 hours. BNP (last 3 results) No results for input(s): PROBNP in the last 8760 hours. CBG:  Recent Labs Lab 12/10/14 1104 12/10/14 1444 12/10/14 1646 12/10/14 2123 12/11/14 0647  GLUCAP 131* 119* 103* 123* 150*    Micro Recent Results (from the past 240 hour(s))  Culture,  blood (routine x 2)     Status: None (Preliminary result)   Collection Time: 12/09/14 10:55 PM  Result Value Ref Range Status   Specimen Description BLOOD FOREARM LEFT  Final   Special Requests BOTTLES DRAWN AEROBIC AND ANAEROBIC 5CC  Final   Culture  Setup Time   Final    12/10/2014 05:06 Performed at [facility]    Culture   Final           BLOOD CULTURE RECEIVED NO GROWTH TO DATE CULTURE WILL BE HELD FOR 5 DAYS BEFORE ISSUING A FINAL NEGATIVE REPORT Performed at [facility]     Report Status PENDING  Incomplete  Culture, blood (routine x 2)     Status: None (Preliminary result)   Collection Time: 12/09/14 10:57 PM  Result Value Ref Range Status   Specimen Description BLOOD RIGHT HAND  Final   Special Requests BOTTLES DRAWN AEROBIC ONLY 4CC  Final   Culture  Setup Time   Final    12/10/2014 05:05 Performed at [facility]    Culture   Final           BLOOD CULTURE RECEIVED NO GROWTH TO DATE CULTURE WILL BE HELD FOR 5 DAYS BEFORE ISSUING A FINAL NEGATIVE REPORT Performed at [facility]    Report Status PENDING  Incomplete  Surgical pcr screen     Status: None   Collection Time: 12/10/14 11:47 AM  Result Value Ref Range Status   MRSA, PCR NEGATIVE NEGATIVE Final   Staphylococcus aureus NEGATIVE NEGATIVE Final    Comment:        The Xpert SA Assay (FDA approved for NASAL specimens in patients over 47 years of age), is one component of a comprehensive surveillance program.  Test performance has been validated by [facility] for patients greater than or equal to 852 year old. It is not intended to diagnose infection nor to guide or monitor treatment.   Anaerobic culture     Status: None (Preliminary result)   Collection Time: 12/10/14  1:27 PM  Result Value Ref Range Status   Specimen Description WOUND RIGHT ARM  Final   Special Requests NONE  Final   Gram Stain   Final    ABUNDANT WBC PRESENT,BOTH PMN AND MONONUCLEAR NO SQUAMOUS EPITHELIAL CELLS SEEN ABUNDANT GRAM POSITIVE COCCI IN PAIRS IN CLUSTERS ABUNDANT GRAM NEGATIVE RODS Performed at [facility]    Culture   Final    NO ANAEROBES ISOLATED; CULTURE IN PROGRESS FOR 5 DAYS Performed at [facility]    Report Status PENDING  Incomplete  Wound culture     Status: None (Preliminary result)   Collection Time: 12/10/14  1:27 PM  Result Value Ref Range Status   Specimen Description WOUND RIGHT ARM  Final   Special Requests NONE  Final   Gram Stain   Final     FEW WBC PRESENT,BOTH PMN AND MONONUCLEAR NO SQUAMOUS EPITHELIAL CELLS SEEN ABUNDANT GRAM POSITIVE COCCI IN PAIRS IN CLUSTERS MODERATE GRAM NEGATIVE RODS Performed at [facility]    Culture   Final    NO GROWTH 1 DAY Performed at [facility]    Report Status PENDING  Incomplete  Gram stain     Status: None   Collection Time: 12/10/14  1:27 PM  Result Value Ref Range Status   Specimen Description WOUND RIGHT ARM  Final   Special Requests NONE  Final   Gram Stain  Final    ABUNDANT WBC PRESENT, PREDOMINANTLY PMN ABUNDANT GRAM NEGATIVE RODS ABUNDANT GRAM POSITIVE COCCI IN PAIRS Gram Stain Report Called to,Read Back By and Verified With: DR Mina Marble AT 1402 12/10/14 BY K BARR    Report Status 12/10/2014 FINAL  Final     Studies: Dg Eye Foreign Body  12/10/2014   CLINICAL DATA:  Metal working/exposure; clearance prior to MRI  EXAM: ORBITS FOR FOREIGN BODY - 2 VIEW  COMPARISON:  None.  FINDINGS: There is no evidence of metallic foreign body within the orbits. No significant bone abnormality identified.  IMPRESSION: No evidence of metallic foreign body within the orbits.   Electronically Signed   By: Christiana Pellant M.D.   On: 12/10/2014 01:56   Mr Forearm Right W/o Cm  12/10/2014   CLINICAL DATA:  Pt injected either "molly" or meth into his arm yesterday and since then is having severe pain, redness, and edema to rt arm. Pt attempted twice with pain meds, unable to complete full exam due to pain. Tried multiple positions to accommodate. Unable to scan humerus or give contrast.  EXAM: MRI OF THE RIGHT FOREARM WITHOUT CONTRAST  TECHNIQUE: Multiplanar, multisequence MR imaging was performed. No intravenous contrast was administered.  COMPARISON:  None.  FINDINGS: Limited examination as the examination could not be completed in its totality. There is also patient motion which degrades image quality.  There is severe muscle edema in the brachioradialis muscle and extensor  carpi radialis longus muscle. There is a 3.5 x 7.1 x 6 cm fluid collection within the brachioradialis muscle. There is abnormal signal within the musculotendinous junction of the brachioradialis. There is severe soft tissue edema around the right elbow extending into the forearm to the level of the wrist. There are no low signal foci to suggest a tear.  The remainder of the muscle groups are normal in signal. There is no marrow signal abnormality. No fracture or dislocation. There is no periostitis. There is no definite elbow joint effusion.  IMPRESSION: 1. Severe muscle edema in the brachioradialis muscle and extensor carpi the radialis longus muscle most consistent with myositis. 3.5 x 7.1 x 6 cm fluid collection within the brachioradialis muscle which may reflect muscle necrosis versus abscess. Circumferential edema around the entirety of the forearm most severe along the radial aspect most concerning for cellulitis. 2. Tendinosis versus partial tear of the brachioradialis tendon at the musculotendinous junction.   Electronically Signed   By: Elige Ko   On: 12/10/2014 08:40    Scheduled Meds: . heparin  5,000 Units Subcutaneous 3 times per day  . insulin aspart  0-9 Units Subcutaneous TID WC  . piperacillin-tazobactam (ZOSYN)  IV  3.375 g Intravenous Q8H  . sodium chloride  3 mL Intravenous Q12H  . vancomycin  1,000 mg Intravenous Q12H   Continuous Infusions: . lactated ringers 10 mL/hr at 12/10/14 1232       Time spent: 35 minutes    [facility]  [facility] Pager [phone] If 7PM-7AM, please contact night-coverage at [email or web address], password [facility] 12/11/2014, 11:12 AM  LOS: 2 days

## 2014-12-12 ENCOUNTER — Encounter (HOSPITAL_COMMUNITY)
Admission: EM | Disposition: A | Discharge status: Home / Self Care | Payer: Self-pay | Source: Home / Self Care | Attending: Internal Medicine

## 2014-12-12 ENCOUNTER — Inpatient Hospital Stay (HOSPITAL_COMMUNITY): Payer: Medicaid Other | Admitting: Certified Registered"

## 2014-12-12 HISTORY — PX: I&D EXTREMITY: SHX5045

## 2014-12-12 LAB — BASIC METABOLIC PANEL
Anion gap: 6 (ref 5–15)
BUN: 10 mg/dL (ref 6–23)
CHLORIDE: 104 meq/L (ref 96–112)
CO2: 26 mmol/L (ref 19–32)
CREATININE: 0.88 mg/dL (ref 0.50–1.35)
Calcium: 8.5 mg/dL (ref 8.4–10.5)
GFR calc Af Amer: >90 mL/min (ref >90)
GFR calc non Af Amer: >90 mL/min (ref >90)
GLUCOSE: 100 mg/dL — AB (ref 70–99)
POTASSIUM: 4.2 mmol/L (ref 3.5–5.1)
Sodium: 136 mmol/L (ref 135–145)

## 2014-12-12 LAB — CBC
HEMATOCRIT: 38.8 % — AB (ref 39.0–52.0)
Hemoglobin: 12.7 g/dL — ABNORMAL LOW (ref 13.0–17.0)
MCH: 29.1 pg (ref 26.0–34.0)
MCHC: 32.7 g/dL (ref 30.0–36.0)
MCV: 89 fL (ref 78.0–100.0)
Platelets: 341 10*3/uL (ref 150–400)
RBC: 4.36 MIL/uL (ref 4.22–5.81)
RDW: 13 % (ref 11.5–15.5)
WBC: 11.5 10*3/uL — ABNORMAL HIGH (ref 4.0–10.5)

## 2014-12-12 LAB — GLUCOSE, CAPILLARY
GLUCOSE-CAPILLARY: 95 mg/dL (ref 70–99)
Glucose-Capillary: 84 mg/dL (ref 70–99)
Glucose-Capillary: 92 mg/dL (ref 70–99)
Glucose-Capillary: 109 mg/dL — ABNORMAL HIGH (ref 70–99)
Glucose-Capillary: 126 mg/dL — ABNORMAL HIGH (ref 70–99)

## 2014-12-12 SURGERY — IRRIGATION AND DEBRIDEMENT EXTREMITY
Anesthesia: General | Site: Arm Lower | Laterality: Right

## 2014-12-12 MED ORDER — HYDROMORPHONE HCL 1 MG/ML IJ SOLN
INTRAMUSCULAR | Status: AC
  Administered 2014-12-12: 15:00:00
  Filled 2014-12-12: qty 1

## 2014-12-12 MED ORDER — SODIUM CHLORIDE 0.9 % IR SOLN
Status: DC | PRN
  Administered 2014-12-12: 1000 mL
  Administered 2014-12-12: 3000 mL

## 2014-12-12 MED ORDER — FENTANYL CITRATE 0.05 MG/ML IJ SOLN
INTRAMUSCULAR | Status: AC
  Filled 2014-12-12: qty 5

## 2014-12-12 MED ORDER — BUPIVACAINE HCL (PF) 0.25 % IJ SOLN
INTRAMUSCULAR | Status: AC
  Filled 2014-12-12: qty 30

## 2014-12-12 MED ORDER — SUCCINYLCHOLINE CHLORIDE 20 MG/ML IJ SOLN
INTRAMUSCULAR | Status: AC
  Filled 2014-12-12: qty 2

## 2014-12-12 MED ORDER — ROCURONIUM BROMIDE 50 MG/5ML IV SOLN
INTRAVENOUS | Status: AC
  Filled 2014-12-12: qty 1

## 2014-12-12 MED ORDER — ONDANSETRON HCL 4 MG/2ML IJ SOLN
INTRAMUSCULAR | Status: DC | PRN
  Administered 2014-12-12: 4 mg via INTRAVENOUS

## 2014-12-12 MED ORDER — HYDROMORPHONE HCL 1 MG/ML IJ SOLN
1.0000 mg | INTRAMUSCULAR | Status: DC | PRN
  Administered 2014-12-12 – 2014-12-15 (×15): 1 mg via INTRAVENOUS
  Filled 2014-12-12 (×15): qty 1

## 2014-12-12 MED ORDER — ONDANSETRON HCL 4 MG/2ML IJ SOLN
4.0000 mg | Freq: Once | INTRAMUSCULAR | Status: DC | PRN
Start: 2014-12-12 — End: 2014-12-12

## 2014-12-12 MED ORDER — HYDROMORPHONE HCL 1 MG/ML IJ SOLN
INTRAMUSCULAR | Status: AC
  Filled 2014-12-12: qty 1

## 2014-12-12 MED ORDER — MIDAZOLAM HCL 5 MG/5ML IJ SOLN
INTRAMUSCULAR | Status: DC | PRN
  Administered 2014-12-12: 2 mg via INTRAVENOUS

## 2014-12-12 MED ORDER — LACTATED RINGERS IV SOLN
INTRAVENOUS | Status: DC | PRN
  Administered 2014-12-12: 09:00:00 via INTRAVENOUS

## 2014-12-12 MED ORDER — LIDOCAINE HCL (CARDIAC) 20 MG/ML IV SOLN
INTRAVENOUS | Status: DC | PRN
  Administered 2014-12-12: 60 mg via INTRAVENOUS

## 2014-12-12 MED ORDER — FENTANYL CITRATE 0.05 MG/ML IJ SOLN
INTRAMUSCULAR | Status: DC | PRN
  Administered 2014-12-12 (×2): 50 ug via INTRAVENOUS
  Administered 2014-12-12 (×2): 100 ug via INTRAVENOUS
  Administered 2014-12-12: 50 ug via INTRAVENOUS

## 2014-12-12 MED ORDER — PROPOFOL 10 MG/ML IV BOLUS
INTRAVENOUS | Status: AC
  Filled 2014-12-12: qty 20

## 2014-12-12 MED ORDER — HYDROMORPHONE HCL 1 MG/ML IJ SOLN
0.2500 mg | INTRAMUSCULAR | Status: DC | PRN
  Administered 2014-12-12 (×3): 0.5 mg via INTRAVENOUS

## 2014-12-12 MED ORDER — MIDAZOLAM HCL 2 MG/2ML IJ SOLN
INTRAMUSCULAR | Status: AC
  Filled 2014-12-12: qty 2

## 2014-12-12 MED ORDER — PROPOFOL 10 MG/ML IV BOLUS
INTRAVENOUS | Status: DC | PRN
  Administered 2014-12-12: 20 mg via INTRAVENOUS
  Administered 2014-12-12: 200 mg via INTRAVENOUS

## 2014-12-12 MED ORDER — BUPIVACAINE HCL (PF) 0.25 % IJ SOLN
INTRAMUSCULAR | Status: DC | PRN
  Administered 2014-12-12: 10 mL

## 2014-12-12 SURGICAL SUPPLY — 52 items
BANDAGE ELASTIC 3 VELCRO ST LF (GAUZE/BANDAGES/DRESSINGS) ×2 IMPLANT
BANDAGE ELASTIC 4 VELCRO ST LF (GAUZE/BANDAGES/DRESSINGS) ×4 IMPLANT
BNDG CMPR 9X4 STRL LF SNTH (GAUZE/BANDAGES/DRESSINGS)
BNDG CONFORM 2 STRL LF (GAUZE/BANDAGES/DRESSINGS) IMPLANT
BNDG ESMARK 4X9 LF (GAUZE/BANDAGES/DRESSINGS) IMPLANT
BNDG GAUZE ELAST 4 BULKY (GAUZE/BANDAGES/DRESSINGS) ×2 IMPLANT
CORDS BIPOLAR (ELECTRODE) IMPLANT
COVER SURGICAL LIGHT HANDLE (MISCELLANEOUS) ×4 IMPLANT
CUFF TOURNIQUET SINGLE 18IN (TOURNIQUET CUFF) ×2 IMPLANT
DECANTER SPIKE VIAL GLASS SM (MISCELLANEOUS) ×2 IMPLANT
DRAIN PENROSE 1/4X12 LTX STRL (WOUND CARE) IMPLANT
DRAPE SURG 17X23 STRL (DRAPES) ×2 IMPLANT
DRSG PAD ABDOMINAL 8X10 ST (GAUZE/BANDAGES/DRESSINGS) ×2 IMPLANT
DURAPREP 26ML APPLICATOR (WOUND CARE) IMPLANT
ELECT REM PT RETURN 9FT ADLT (ELECTROSURGICAL)
ELECTRODE REM PT RTRN 9FT ADLT (ELECTROSURGICAL) IMPLANT
GAUZE PACKING IODOFORM 1/4X15 (GAUZE/BANDAGES/DRESSINGS) ×2 IMPLANT
GAUZE PACKING IODOFORM 1/4X5 (PACKING) IMPLANT
GAUZE SPONGE 4X4 12PLY STRL (GAUZE/BANDAGES/DRESSINGS) ×2 IMPLANT
GAUZE XEROFORM 1X8 LF (GAUZE/BANDAGES/DRESSINGS) ×2 IMPLANT
GLOVE SURG SYN 8.0 (GLOVE) ×2 IMPLANT
GOWN STRL REUS W/ TWL LRG LVL3 (GOWN DISPOSABLE) ×1 IMPLANT
GOWN STRL REUS W/ TWL XL LVL3 (GOWN DISPOSABLE) ×1 IMPLANT
GOWN STRL REUS W/TWL LRG LVL3 (GOWN DISPOSABLE) ×2
GOWN STRL REUS W/TWL XL LVL3 (GOWN DISPOSABLE) ×2
HANDPIECE INTERPULSE COAX TIP (DISPOSABLE)
KIT BASIN OR (CUSTOM PROCEDURE TRAY) ×2 IMPLANT
KIT ROOM TURNOVER OR (KITS) ×2 IMPLANT
MANIFOLD NEPTUNE II (INSTRUMENTS) ×2 IMPLANT
NEEDLE HYPO 25GX1X1/2 BEV (NEEDLE) IMPLANT
NEEDLE HYPO 25X1 1.5 SAFETY (NEEDLE) ×2 IMPLANT
NS IRRIG 1000ML POUR BTL (IV SOLUTION) ×2 IMPLANT
PACK ORTHO EXTREMITY (CUSTOM PROCEDURE TRAY) ×2 IMPLANT
PAD ABD 8X10 STRL (GAUZE/BANDAGES/DRESSINGS) ×2 IMPLANT
PAD ARMBOARD 7.5X6 YLW CONV (MISCELLANEOUS) ×4 IMPLANT
PAD CAST 4YDX4 CTTN HI CHSV (CAST SUPPLIES) ×2 IMPLANT
PADDING CAST COTTON 4X4 STRL (CAST SUPPLIES) ×4
SET HNDPC FAN SPRY TIP SCT (DISPOSABLE) IMPLANT
SPONGE LAP 18X18 X RAY DECT (DISPOSABLE) ×2 IMPLANT
SUCTION FRAZIER TIP 10 FR DISP (SUCTIONS) ×2 IMPLANT
SUT ETHILON 2 0 PSLX (SUTURE) ×2 IMPLANT
SUT ETHILON 3 0 PS 1 (SUTURE) ×4 IMPLANT
SUT VICRYL RAPIDE 4/0 PS 2 (SUTURE) IMPLANT
SYR 20CC LL (SYRINGE) IMPLANT
SYR CONTROL 10ML LL (SYRINGE) ×2 IMPLANT
TOWEL OR 17X24 6PK STRL BLUE (TOWEL DISPOSABLE) ×2 IMPLANT
TOWEL OR 17X26 10 PK STRL BLUE (TOWEL DISPOSABLE) ×2 IMPLANT
TUBE ANAEROBIC SPECIMEN COL (MISCELLANEOUS) IMPLANT
TUBE CONNECTING 12X1/4 (SUCTIONS) ×2 IMPLANT
UNDERPAD 30X30 INCONTINENT (UNDERPADS AND DIAPERS) ×2 IMPLANT
WATER STERILE IRR 1000ML POUR (IV SOLUTION) ×2 IMPLANT
YANKAUER SUCT BULB TIP NO VENT (SUCTIONS) ×2 IMPLANT

## 2014-12-12 NOTE — Transfer of Care (Signed)
Immediate Anesthesia Transfer of Care Note  Patient: Christopher [name]  Procedure(s) Performed: Procedure(s) with comments: IRRIGATION AND DEBRIDEMENT EXTREMITY (Right) - forearm  Patient Location: PACU  Anesthesia Type:General  Level of Consciousness: awake, alert  and oriented  Airway & Oxygen Therapy: Patient Spontanous Breathing  Post-op Assessment: Report given to PACU RN  Post vital signs: stable  Complications: No apparent anesthesia complications

## 2014-12-12 NOTE — Progress Notes (Signed)
CSW met with this 48 y/o, single, Caucasian, male for a social work consult.  Patient presents in hospital garb, with a pained affect, "I am in pain."  Patient showed his swollen right arm and stated, "This is my wake up call, if I cannot use this arm I will be living under a bridge."  Patient discussed how he had gone through phases of drug use, "I used alcohol for awhile and quit, marijuana for awhile and quit, then I got introduced to [place] and that was great, I got this 68 y/o girlfriend and she is into this.  I got her calling me she is in jail.  I am done with this stuff and and with her."  Patient states he has a stable home to discharge to and has no case management needs.  Patient states he will call CSW if he has any other needs.  Patient declined any Substance Abuse outpatient resources or referrals, including behaviorial health referrals and 12 step groups.  Patient states he appreciates the concern, but states "I am done.  I have two daughters and a grandkid.  I have to grow up and get back to work.  Leave your card and I will call if I need anything."  CSW signing off.  [facility] Georgann Bramble [facility] CSW [phone]

## 2014-12-12 NOTE — Anesthesia Procedure Notes (Signed)
Procedure Name: LMA Insertion Date/Time: 12/12/2014 9:58 AM Performed by: Ellin GoodieWEAVER, Verenise Moulin M Pre-anesthesia Checklist: Patient identified, Emergency Drugs available, Suction available, Patient being monitored and Timeout performed Patient Re-evaluated:Patient Re-evaluated prior to inductionOxygen Delivery Method: Circle system utilized Preoxygenation: Pre-oxygenation with 100% oxygen Intubation Type: IV induction LMA: LMA inserted LMA Size: 5.0 Number of attempts: 1 Placement Confirmation: positive ETCO2 and breath sounds checked- equal and bilateral Tube secured with: Tape Dental Injury: Teeth and Oropharynx as per pre-operative assessment

## 2014-12-12 NOTE — Progress Notes (Signed)
Witnessed patient signature on consent form that MD Mina Marble(Weingold) filled out at bedside after patient signed same.

## 2014-12-12 NOTE — Progress Notes (Signed)
[facility] PROGRESS NOTE   Christopher [name] WUJ:[id number] DOB: 12-28-1965 DOA: 12/09/2014 PCP: Rudi Heap, MD  HPI/Subjective: No fever or chills since yesterday, to the OR again for repeat of the I&D.  Assessment/Plan: Principal Problem:   Cellulitis of right arm Active Problems:   Abscess of arm, right   Hyperglycemia   IV drug user   Hyponatremia   Right forearm deep tissue abscess with pyomyositis Secondary to cellulitis and abscess and resultant pyomyositis. This is started after self injection with IV drugs. Status post I&D done by Dr. Mina Marble of orthopedics, recommends tocontinue IV antibiotics. Gram stain is showing polymicrobial infection with GPC in clusters and pairs and GNRs. Continue current antibiotics. WBC improving.  Right arm cellulitis/pyomyositis Continue IV antibiotics. Continue to control pain with narcotics.  Hyperglycemia Blood sugar was up to 156, is likely secondary to stress reaction from infection. A1c is 5.5, no indication for hypoglycemic agents.  Sepsis Early sepsis with WBC of 21.5, heart rate of 102 and presence of right upper extremity cellulitis. Patient hydrated with IV fluids, continue IV antibiotics.  IV drug use Patient counseled extensively.  Code Status: Full code Family Communication: Plan discussed with the patient. Disposition Plan: Remains inpatient   Consultants:  Orthopedics  Procedures:  Incision and drainage of proximal forearm dorsal deep abscess done by Dr. Mina Marble. On 12/10/2014.  Antibiotics:  Zosyn and vancomycin   Objective: Filed Vitals:   12/12/14 0908  BP: 118/82  Pulse: 83  Temp: 97.6 F (36.4 C)  Resp: 16    Intake/Output Summary (Last 24 hours) at 12/12/14 1012 Last data filed at 12/11/14 1700  Gross per 24 hour  Intake    480 ml  Output      0 ml  Net    480 ml   There were no vitals filed for this visit.  Exam: General: Alert and awake, oriented x3, not in any  acute distress. HEENT: anicteric sclera, pupils reactive to light and accommodation, EOMI CVS: S1-S2 clear, no murmur rubs or gallops Chest: clear to auscultation bilaterally, no wheezing, rales or rhonchi Abdomen: soft nontender, nondistended, normal bowel sounds, no organomegaly Extremities: no cyanosis, clubbing or edema noted bilaterally Neuro: Cranial nerves II-XII intact, no focal neurological deficits  Data Reviewed: Basic Metabolic Panel:  Recent Labs Lab 12/09/14 2218 12/10/14 1120 12/11/14 0523 12/12/14 0505  NA 131*  --  137 136  K 4.2  --  4.0 4.2  CL 97  --  104 104  CO2 24  --  27 26  GLUCOSE 156*  --  149* 100*  BUN 10  --  12 10  CREATININE 1.06 1.00 1.10 0.88  CALCIUM 8.9  --  8.3* 8.5   Liver Function Tests:  Recent Labs Lab 12/09/14 2218  AST 41*  ALT 40  ALKPHOS 113  BILITOT 0.8  PROT 7.2  ALBUMIN 3.4*   No results for input(s): LIPASE, AMYLASE in the last 168 hours. No results for input(s): AMMONIA in the last 168 hours. CBC:  Recent Labs Lab 12/09/14 2218 12/10/14 1120 12/11/14 0523 12/12/14 0505  WBC 21.5* 20.3* 17.5* 11.5*  NEUTROABS 18.4*  --   --   --   HGB 14.5 13.5 12.9* 12.7*  HCT 43.1 40.5 39.5 38.8*  MCV 88.7 88.6 89.0 89.0  PLT 312 288 284 341   Cardiac Enzymes: No results for input(s): CKTOTAL, CKMB, CKMBINDEX, TROPONINI in the last 168 hours. BNP (last 3 results) No results for input(s): PROBNP in the  last 8760 hours. CBG:  Recent Labs Lab 12/11/14 1218 12/11/14 1648 12/11/14 2131 12/12/14 0641 12/12/14 0937  GLUCAP 136* 114* 125* 84 92    Micro Recent Results (from the past 240 hour(s))  Culture, blood (routine x 2)     Status: None (Preliminary result)   Collection Time: 12/09/14 10:55 PM  Result Value Ref Range Status   Specimen Description BLOOD FOREARM LEFT  Final   Special Requests BOTTLES DRAWN AEROBIC AND ANAEROBIC 5CC  Final   Culture  Setup Time   Final    12/10/2014 05:06 Performed at [facility]    Culture   Final           BLOOD CULTURE RECEIVED NO GROWTH TO DATE CULTURE WILL BE HELD FOR 5 DAYS BEFORE ISSUING A FINAL NEGATIVE REPORT Performed at [facility]    Report Status PENDING  Incomplete  Culture, blood (routine x 2)     Status: None (Preliminary result)   Collection Time: 12/09/14 10:57 PM  Result Value Ref Range Status   Specimen Description BLOOD RIGHT HAND  Final   Special Requests BOTTLES DRAWN AEROBIC ONLY 4CC  Final   Culture  Setup Time   Final    12/10/2014 05:05 Performed at [facility]    Culture   Final           BLOOD CULTURE RECEIVED NO GROWTH TO DATE CULTURE WILL BE HELD FOR 5 DAYS BEFORE ISSUING A FINAL NEGATIVE REPORT Performed at [facility]    Report Status PENDING  Incomplete  Surgical pcr screen     Status: None   Collection Time: 12/10/14 11:47 AM  Result Value Ref Range Status   MRSA, PCR NEGATIVE NEGATIVE Final   Staphylococcus aureus NEGATIVE NEGATIVE Final    Comment:        The Xpert SA Assay (FDA approved for NASAL specimens in patients over 47 years of age), is one component of a comprehensive surveillance program.  Test performance has been validated by [facility] for patients greater than or equal to 47 year old. It is not intended to diagnose infection nor to guide or monitor treatment.   Anaerobic culture     Status: None (Preliminary result)   Collection Time: 12/10/14  1:27 PM  Result Value Ref Range Status   Specimen Description WOUND RIGHT ARM  Final   Special Requests NONE  Final   Gram Stain   Final    ABUNDANT WBC PRESENT,BOTH PMN AND MONONUCLEAR NO SQUAMOUS EPITHELIAL CELLS SEEN ABUNDANT GRAM POSITIVE COCCI IN PAIRS IN CLUSTERS ABUNDANT GRAM NEGATIVE RODS Performed at [facility]    Culture   Final    NO ANAEROBES ISOLATED; CULTURE IN PROGRESS FOR 5 DAYS Performed at [facility]    Report Status PENDING  Incomplete  Wound culture     Status: None  (Preliminary result)   Collection Time: 12/10/14  1:27 PM  Result Value Ref Range Status   Specimen Description WOUND RIGHT ARM  Final   Special Requests NONE  Final   Gram Stain   Final    FEW WBC PRESENT,BOTH PMN AND MONONUCLEAR NO SQUAMOUS EPITHELIAL CELLS SEEN ABUNDANT GRAM POSITIVE COCCI IN PAIRS IN CLUSTERS MODERATE GRAM NEGATIVE RODS Performed at [facility]    Culture   Final    NO GROWTH 1 DAY Performed at [facility]    Report Status PENDING  Incomplete  Gram stain  Status: None   Collection Time: 12/10/14  1:27 PM  Result Value Ref Range Status   Specimen Description WOUND RIGHT ARM  Final   Special Requests NONE  Final   Gram Stain   Final    ABUNDANT WBC PRESENT, PREDOMINANTLY PMN ABUNDANT GRAM NEGATIVE RODS ABUNDANT GRAM POSITIVE COCCI IN PAIRS Gram Stain Report Called to,Read Back By and Verified With: DR Mina MarbleWEINGOLD AT 1402 12/10/14 BY K BARR    Report Status 12/10/2014 FINAL  Final     Studies: No results found.  Scheduled Meds: . [MAR Hold] heparin  5,000 Units Subcutaneous 3 times per day  . [MAR Hold] insulin aspart  0-9 Units Subcutaneous TID WC  . [MAR Hold] nicotine  21 mg Transdermal Daily  . [MAR Hold] piperacillin-tazobactam (ZOSYN)  IV  3.375 g Intravenous Q8H  . [MAR Hold] sodium chloride  3 mL Intravenous Q12H  . [MAR Hold] vancomycin  1,000 mg Intravenous Q12H   Continuous Infusions: . lactated ringers 10 mL/hr at 12/10/14 1232       Time spent: 35 minutes    [facility]MA[facility],Mahkayla Preece A  [facility] Pager [phone] If 7PM-7AM, please contact night-coverage at [email or web address], password [email or web address] 12/12/2014, 10:12 AM  LOS: 3 days

## 2014-12-12 NOTE — Op Note (Signed)
See note [phone]

## 2014-12-12 NOTE — Interval H&P Note (Signed)
History and Physical Interval Note:  12/12/2014 9:10 AM  Christopher [name]  has presented today for surgery, with the diagnosis of right forearm infection  The various methods of treatment have been discussed with the patient and family. After consideration of risks, benefits and other options for treatment, the patient has consented to  Procedure(s): IRRIGATION AND DEBRIDEMENT EXTREMITY (Right) as a surgical intervention .  The patient's history has been reviewed, patient examined, no change in status, stable for surgery.  I have reviewed the patient's chart and labs.  Questions were answered to the patient's satisfaction.     Dairl PonderWEINGOLD,Evelena Masci A

## 2014-12-12 NOTE — Anesthesia Preprocedure Evaluation (Addendum)
Anesthesia Evaluation  Patient identified by MRN, date of birth, ID band Patient awake    Reviewed: Allergy & Precautions, H&P , NPO status , Patient's Chart, lab work & pertinent test results  Airway Mallampati: II  TM Distance: >3 FB Neck ROM: Full    Dental  (+) Teeth Intact, Dental Advisory Given   Pulmonary Current Smoker,  breath sounds clear to auscultation        Cardiovascular negative cardio ROS  Rhythm:Regular Rate:Normal     Neuro/Psych negative neurological ROS  negative psych ROS   GI/Hepatic negative GI ROS, (+)     substance abuse  IV drug use,   Endo/Other  negative endocrine ROS  Renal/GU      Musculoskeletal negative musculoskeletal ROS (+)   Abdominal   Peds  Hematology negative hematology ROS (+)   Anesthesia Other Findings   Reproductive/Obstetrics negative OB ROS                           Anesthesia Physical Anesthesia Plan  ASA: III  Anesthesia Plan: General   Post-op Pain Management:    Induction:   Airway Management Planned: LMA  Additional Equipment:   Intra-op Plan:   Post-operative Plan:   Informed Consent: I have reviewed the patients History and Physical, chart, labs and discussed the procedure including the risks, benefits and alternatives for the proposed anesthesia with the patient or authorized representative who has indicated his/her understanding and acceptance.   Dental advisory given  Plan Discussed with: CRNA  Anesthesia Plan Comments:         Anesthesia Quick Evaluation

## 2014-12-12 NOTE — Anesthesia Postprocedure Evaluation (Signed)
  Anesthesia Post-op Note  Patient: Christopher [name]  Procedure(s) Performed: Procedure(s) with comments: IRRIGATION AND DEBRIDEMENT EXTREMITY (Right) - forearm  Patient Location: PACU  Anesthesia Type:General  Level of Consciousness: awake, alert  and oriented  Airway and Oxygen Therapy: Patient Spontanous Breathing and Patient connected to nasal cannula oxygen  Post-op Pain: mild  Post-op Assessment: Post-op Vital signs reviewed, Patient's Cardiovascular Status Stable, Respiratory Function Stable, Patent Airway, No signs of Nausea or vomiting and Pain level controlled  Post-op Vital Signs: stable  Last Vitals:  Filed Vitals:   12/12/14 1544  BP: 118/67  Pulse: 87  Temp: 37.1 C  Resp: 16    Complications: No apparent anesthesia complications

## 2014-12-12 NOTE — Progress Notes (Signed)
Patient underwent repeat I and D and partial wound closure this AM  Would convert tp PO abx when WBC normal and pending final cultures   Will need to see in my office Monday afternoon for dressing change

## 2014-12-12 NOTE — Op Note (Signed)
Christopher Silvius:  [name], Christopher            ACCOUNT NO.:  [id number]  MEDICAL RECORD NO.:  [id number]  LOCATION:  [facility]                         FACILITY:  [facility]  PHYSICIAN:  Artist PaisMatthew A. Deone Leifheit, M.D.DATE OF BIRTH:  February 24, 1966  DATE OF PROCEDURE:  12/12/2014 DATE OF DISCHARGE:                              OPERATIVE REPORT   PREOPERATIVE DIAGNOSIS:  Deep abscess, proximal forearm right side.  POSTOPERATIVE DIAGNOSIS:  Deep abscess, proximal forearm right side.  PROCEDURE:  Repeat incision and drainage with partial secondary wound closure.  SURGEON:  Artist PaisMatthew A. Mina MarbleWeingold, M.D.  ASSISTANT:  None.  ANESTHESIA:  General.  COMPLICATIONS:  No complication.  DRAINS:  No drains.  DESCRIPTION OF PROCEDURE:  The patient was taken to the operating suite. After induction of general anesthetic, old packing was removed.  Right upper extremity prepped and draped in sterile fashion.  An Esmarch was used to exsanguinate the limb.  Tourniquet was inflated to 250 mmHg.  At this point in time, we used 3 L of normal saline using cystoscopy tubing and irrigated the open wound.  There was no reaccumulation of purulence. All the muscle looked viable.  There was no debridement.  We then loosely closed the wound with a combination of 2 and 3-0 nylon leaving some of the muscle exposed longitudinally and allowed to granulate in. We also loosely packed some iodoform gauze.  We then dressed with Xeroform, 4 x 4s, Kerlix, ABDs, and a compression wrap.  The patient tolerated the procedure well in concealed fashion.     Artist PaisMatthew A. Mina MarbleWeingold, M.D.     MAW/MEDQ  D:  12/12/2014  T:  12/12/2014  Job:  [id number]

## 2014-12-13 DIAGNOSIS — M6009 Infective myositis, multiple sites: Secondary | ICD-10-CM

## 2014-12-13 DIAGNOSIS — M60009 Infective myositis, unspecified site: Secondary | ICD-10-CM | POA: Diagnosis present

## 2014-12-13 LAB — CBC
HCT: 37.9 % — ABNORMAL LOW (ref 39.0–52.0)
Hemoglobin: 12.7 g/dL — ABNORMAL LOW (ref 13.0–17.0)
MCH: 30 pg (ref 26.0–34.0)
MCHC: 33.5 g/dL (ref 30.0–36.0)
MCV: 89.6 fL (ref 78.0–100.0)
Platelets: 407 10*3/uL — ABNORMAL HIGH (ref 150–400)
RBC: 4.23 MIL/uL (ref 4.22–5.81)
RDW: 13 % (ref 11.5–15.5)
WBC: 11.5 10*3/uL — ABNORMAL HIGH (ref 4.0–10.5)

## 2014-12-13 LAB — BASIC METABOLIC PANEL
Anion gap: 10 (ref 5–15)
BUN: 10 mg/dL (ref 6–23)
CO2: 23 mmol/L (ref 19–32)
Calcium: 8.7 mg/dL (ref 8.4–10.5)
Chloride: 104 mEq/L (ref 96–112)
Creatinine, Ser: 1.01 mg/dL (ref 0.50–1.35)
GFR calc Af Amer: >90 mL/min (ref >90)
GFR calc non Af Amer: 87 mL/min — ABNORMAL LOW (ref >90)
Glucose, Bld: 163 mg/dL — ABNORMAL HIGH (ref 70–99)
Potassium: 3.8 mmol/L (ref 3.5–5.1)
Sodium: 137 mmol/L (ref 135–145)

## 2014-12-13 LAB — GLUCOSE, CAPILLARY
GLUCOSE-CAPILLARY: 136 mg/dL — AB (ref 70–99)
Glucose-Capillary: 166 mg/dL — ABNORMAL HIGH (ref 70–99)

## 2014-12-13 MED ORDER — POTASSIUM CHLORIDE CRYS ER 20 MEQ PO TBCR
40.0000 meq | EXTENDED_RELEASE_TABLET | Freq: Once | ORAL | Status: AC
  Administered 2014-12-13: 40 meq via ORAL
  Filled 2014-12-13: qty 2

## 2014-12-13 NOTE — Progress Notes (Signed)
Pt was asking about taking a shower today. He will need order if allowed.

## 2014-12-13 NOTE — Progress Notes (Signed)
[facility] PROGRESS NOTE   Darrow BussingRichard K Cederberg ZOX:[id number] DOB: 03/25/1966 DOA: 12/09/2014 PCP: Rudi HeapMOORE, [name], MD  HPI/Subjective: Had I&D twice so far,  Assessment/Plan: Principal Problem:   Cellulitis of right arm Active Problems:   Abscess of arm, right   Hyperglycemia   IV drug user   Hyponatremia   Pyomyositis   Right forearm deep tissue abscess with pyomyositis Secondary to cellulitis and abscess and resultant pyomyositis. This is started after self injection with IV drugs. Status post I&D2 by Dr. Mina MarbleWeingold of orthopedics, recommends tocontinue IV antibiotics. Gram stain is showing polymicrobial infection with GPC in clusters and pairs and GNRs. Continue current antibiotics. WBC improving. culture is growing GNR.  Right arm cellulitis/pyomyositis Continue IV antibiotics. Continue to control pain with narcotics. Elevate right upper extremity   Hyperglycemia Blood sugar was up to 156, is likely secondary to stress reaction from infection. A1c is 5.5, no indication for hypoglycemic agents.  Sepsis Early sepsis with WBC of 21.5, heart rate of 102 and presence of right upper extremity cellulitis. Patient hydrated with IV fluids, continue IV antibiotics.  IV drug use Patient counseled extensively.  Code Status: Full code Family Communication: Plan discussed with the patient. Disposition Plan: Remains inpatient   Consultants:  Orthopedics  Procedures:  Incision and drainage of proximal forearm dorsal deep abscess done by Dr. Mina MarbleWeingold. On 12/10/2014.  Antibiotics:  Zosyn and vancomycin   Objective: Filed Vitals:   12/13/14 0640  BP: 112/75  Pulse: 78  Temp: 98 F (36.7 C)  Resp: 18    Intake/Output Summary (Last 24 hours) at 12/13/14 1059 Last data filed at 12/13/14 0640  Gross per 24 hour  Intake   1030 ml  Output   1750 ml  Net   -720 ml   There were no vitals filed for this visit.  Exam: General: Alert and awake, oriented x3,  not in any acute distress. HEENT: anicteric sclera, pupils reactive to light and accommodation, EOMI CVS: S1-S2 clear, no murmur rubs or gallops Chest: clear to auscultation bilaterally, no wheezing, rales or rhonchi Abdomen: soft nontender, nondistended, normal bowel sounds, no organomegaly Extremities: no cyanosis, clubbing or edema noted bilaterally Neuro: Cranial nerves II-XII intact, no focal neurological deficits  Data Reviewed: Basic Metabolic Panel:  Recent Labs Lab 12/09/14 2218 12/10/14 1120 12/11/14 0523 12/12/14 0505 12/13/14 0550  NA 131*  --  137 136 137  K 4.2  --  4.0 4.2 3.8  CL 97  --  104 104 104  CO2 24  --  27 26 23   GLUCOSE 156*  --  149* 100* 163*  BUN 10  --  12 10 10   CREATININE 1.06 1.00 1.10 0.88 1.01  CALCIUM 8.9  --  8.3* 8.5 8.7   Liver Function Tests:  Recent Labs Lab 12/09/14 2218  AST 41*  ALT 40  ALKPHOS 113  BILITOT 0.8  PROT 7.2  ALBUMIN 3.4*   No results for input(s): LIPASE, AMYLASE in the last 168 hours. No results for input(s): AMMONIA in the last 168 hours. CBC:  Recent Labs Lab 12/09/14 2218 12/10/14 1120 12/11/14 0523 12/12/14 0505 12/13/14 0550  WBC 21.5* 20.3* 17.5* 11.5* 11.5*  NEUTROABS 18.4*  --   --   --   --   HGB 14.5 13.5 12.9* 12.7* 12.7*  HCT 43.1 40.5 39.5 38.8* 37.9*  MCV 88.7 88.6 89.0 89.0 89.6  PLT 312 288 284 341 407*   Cardiac Enzymes: No results for input(s): CKTOTAL, CKMB, CKMBINDEX, TROPONINI  in the last 168 hours. BNP (last 3 results) No results for input(s): PROBNP in the last 8760 hours. CBG:  Recent Labs Lab 12/12/14 0937 12/12/14 1142 12/12/14 1620 12/12/14 2135 12/13/14 0637  GLUCAP 92 95 109* 126* 136*    Micro Recent Results (from the past 240 hour(s))  Culture, blood (routine x 2)     Status: None (Preliminary result)   Collection Time: 12/09/14 10:55 PM  Result Value Ref Range Status   Specimen Description BLOOD FOREARM LEFT  Final   Special Requests BOTTLES DRAWN  AEROBIC AND ANAEROBIC 5CC  Final   Culture  Setup Time   Final    12/10/2014 05:06 Performed at [facility]    Culture   Final           BLOOD CULTURE RECEIVED NO GROWTH TO DATE CULTURE WILL BE HELD FOR 5 DAYS BEFORE ISSUING A FINAL NEGATIVE REPORT Performed at [facility]    Report Status PENDING  Incomplete  Culture, blood (routine x 2)     Status: None (Preliminary result)   Collection Time: 12/09/14 10:57 PM  Result Value Ref Range Status   Specimen Description BLOOD RIGHT HAND  Final   Special Requests BOTTLES DRAWN AEROBIC ONLY 4CC  Final   Culture  Setup Time   Final    12/10/2014 05:05 Performed at [facility]    Culture   Final           BLOOD CULTURE RECEIVED NO GROWTH TO DATE CULTURE WILL BE HELD FOR 5 DAYS BEFORE ISSUING A FINAL NEGATIVE REPORT Performed at [facility]    Report Status PENDING  Incomplete  Surgical pcr screen     Status: None   Collection Time: 12/10/14 11:47 AM  Result Value Ref Range Status   MRSA, PCR NEGATIVE NEGATIVE Final   Staphylococcus aureus NEGATIVE NEGATIVE Final    Comment:        The Xpert SA Assay (FDA approved for NASAL specimens in patients over 47 years of age), is one component of a comprehensive surveillance program.  Test performance has been validated by [facility] for patients greater than or equal to 47 year old. It is not intended to diagnose infection nor to guide or monitor treatment.   Anaerobic culture     Status: None (Preliminary result)   Collection Time: 12/10/14  1:27 PM  Result Value Ref Range Status   Specimen Description WOUND RIGHT ARM  Final   Special Requests NONE  Final   Gram Stain   Final    ABUNDANT WBC PRESENT,BOTH PMN AND MONONUCLEAR NO SQUAMOUS EPITHELIAL CELLS SEEN ABUNDANT GRAM POSITIVE COCCI IN PAIRS IN CLUSTERS ABUNDANT GRAM NEGATIVE RODS Performed at [facility]    Culture   Final    NO ANAEROBES ISOLATED; CULTURE IN PROGRESS FOR 5  DAYS Performed at [facility]    Report Status PENDING  Incomplete  Wound culture     Status: None (Preliminary result)   Collection Time: 12/10/14  1:27 PM  Result Value Ref Range Status   Specimen Description WOUND RIGHT ARM  Final   Special Requests NONE  Final   Gram Stain   Final    FEW WBC PRESENT,BOTH PMN AND MONONUCLEAR NO SQUAMOUS EPITHELIAL CELLS SEEN ABUNDANT GRAM POSITIVE COCCI IN PAIRS IN CLUSTERS MODERATE GRAM NEGATIVE RODS Performed at [facility]    Culture   Final    MODERATE GRAM NEGATIVE RODS Performed at [facility]    Report Status PENDING  Incomplete  Gram stain     Status: None   Collection Time: 12/10/14  1:27 PM  Result Value Ref Range Status   Specimen Description WOUND RIGHT ARM  Final   Special Requests NONE  Final   Gram Stain   Final    ABUNDANT WBC PRESENT, PREDOMINANTLY PMN ABUNDANT GRAM NEGATIVE RODS ABUNDANT GRAM POSITIVE COCCI IN PAIRS Gram Stain Report Called to,Read Back By and Verified With: DR Mina Marble AT 1402 12/10/14 BY K BARR    Report Status 12/10/2014 FINAL  Final     Studies: No results found.  Scheduled Meds: . heparin  5,000 Units Subcutaneous 3 times per day  . insulin aspart  0-9 Units Subcutaneous TID WC  . nicotine  21 mg Transdermal Daily  . piperacillin-tazobactam (ZOSYN)  IV  3.375 g Intravenous Q8H  . sodium chloride  3 mL Intravenous Q12H  . vancomycin  1,000 mg Intravenous Q12H   Continuous Infusions: . lactated ringers 10 mL/hr at 12/10/14 1232       Time spent: 35 minutes    [facility] A  [facility] Pager [phone] If 7PM-7AM, please contact night-coverage at [email or web address], password [facility] 12/13/2014, 10:59 AM  LOS: 4 days

## 2014-12-13 NOTE — Progress Notes (Signed)
ANTIBIOTIC CONSULT NOTE - FOLLOW UP  Pharmacy Consult for Vancomycin and Zosyn Indication: R arm abscess w/ pyomyositis  No Known Allergies  Patient Measurements: Height: 5' 8.9" (175 cm) IBW/kg (Calculated) : 70.47  Vital Signs: Temp: 98 F (36.7 C) (12/27 0640) Temp Source: Oral (12/27 0640) BP: 112/75 mmHg (12/27 0640) Pulse Rate: 78 (12/27 0640) Intake/Output from previous day: 12/26 0701 - 12/27 0700 In: 1730 [P.O.:480; I.V.:1000; IV Piggyback:250] Out: 1750 [Urine:1750] Intake/Output from this shift:    Labs:  Recent Labs  12/11/14 0523 12/12/14 0505 12/13/14 0550  WBC 17.5* 11.5* 11.5*  HGB 12.9* 12.7* 12.7*  PLT 284 341 407*  CREATININE 1.10 0.88 1.01   Estimated Creatinine Clearance: 90.2 mL/min (by C-G formula based on Cr of 1.01). No results for input(s): VANCOTROUGH, VANCOPEAK, VANCORANDOM, GENTTROUGH, GENTPEAK, GENTRANDOM, TOBRATROUGH, TOBRAPEAK, TOBRARND, AMIKACINPEAK, AMIKACINTROU, AMIKACIN in the last 72 hours.   Microbiology: Recent Results (from the past 720 hour(s))  Culture, blood (routine x 2)     Status: None (Preliminary result)   Collection Time: 12/09/14 10:55 PM  Result Value Ref Range Status   Specimen Description BLOOD FOREARM LEFT  Final   Special Requests BOTTLES DRAWN AEROBIC AND ANAEROBIC 5CC  Final   Culture  Setup Time   Final    12/10/2014 05:06 Performed at [facility]    Culture   Final           BLOOD CULTURE RECEIVED NO GROWTH TO DATE CULTURE WILL BE HELD FOR 5 DAYS BEFORE ISSUING A FINAL NEGATIVE REPORT Performed at [facility]    Report Status PENDING  Incomplete  Culture, blood (routine x 2)     Status: None (Preliminary result)   Collection Time: 12/09/14 10:57 PM  Result Value Ref Range Status   Specimen Description BLOOD RIGHT HAND  Final   Special Requests BOTTLES DRAWN AEROBIC ONLY 4CC  Final   Culture  Setup Time   Final    12/10/2014 05:05 Performed at [facility]    Culture    Final           BLOOD CULTURE RECEIVED NO GROWTH TO DATE CULTURE WILL BE HELD FOR 5 DAYS BEFORE ISSUING A FINAL NEGATIVE REPORT Performed at [facility]    Report Status PENDING  Incomplete  Surgical pcr screen     Status: None   Collection Time: 12/10/14 11:47 AM  Result Value Ref Range Status   MRSA, PCR NEGATIVE NEGATIVE Final   Staphylococcus aureus NEGATIVE NEGATIVE Final    Comment:        The Xpert SA Assay (FDA approved for NASAL specimens in patients over 47 years of age), is one component of a comprehensive surveillance program.  Test performance has been validated by [facility] for patients greater than or equal to 721 year old. It is not intended to diagnose infection nor to guide or monitor treatment.   Anaerobic culture     Status: None (Preliminary result)   Collection Time: 12/10/14  1:27 PM  Result Value Ref Range Status   Specimen Description WOUND RIGHT ARM  Final   Special Requests NONE  Final   Gram Stain   Final    ABUNDANT WBC PRESENT,BOTH PMN AND MONONUCLEAR NO SQUAMOUS EPITHELIAL CELLS SEEN ABUNDANT GRAM POSITIVE COCCI IN PAIRS IN CLUSTERS ABUNDANT GRAM NEGATIVE RODS Performed at [facility]    Culture   Final    NO ANAEROBES ISOLATED; CULTURE IN PROGRESS FOR 5 DAYS Performed  at [facility]    Report Status PENDING  Incomplete  Wound culture     Status: None (Preliminary result)   Collection Time: 12/10/14  1:27 PM  Result Value Ref Range Status   Specimen Description WOUND RIGHT ARM  Final   Special Requests NONE  Final   Gram Stain   Final    FEW WBC PRESENT,BOTH PMN AND MONONUCLEAR NO SQUAMOUS EPITHELIAL CELLS SEEN ABUNDANT GRAM POSITIVE COCCI IN PAIRS IN CLUSTERS MODERATE GRAM NEGATIVE RODS Performed at [facility]    Culture   Final    MODERATE GRAM NEGATIVE RODS Performed at [facility]    Report Status PENDING  Incomplete  Gram stain     Status: None   Collection Time: 12/10/14   1:27 PM  Result Value Ref Range Status   Specimen Description WOUND RIGHT ARM  Final   Special Requests NONE  Final   Gram Stain   Final    ABUNDANT WBC PRESENT, PREDOMINANTLY PMN ABUNDANT GRAM NEGATIVE RODS ABUNDANT GRAM POSITIVE COCCI IN PAIRS Gram Stain Report Called to,Read Back By and Verified With: [facility] AT 1402 12/10/14 BY K BARR    Report Status 12/10/2014 FINAL  Final    Anti-infectives    Start     Dose/Rate Route Frequency Ordered Stop   12/10/14 1800  vancomycin (VANCOCIN) IVPB 1000 mg/200 mL premix     1,000 mg200 mL/hr over 60 Minutes Intravenous Every 12 hours 12/10/14 1717     12/10/14 1730  piperacillin-tazobactam (ZOSYN) IVPB 3.375 g     3.375 g12.5 mL/hr over 240 Minutes Intravenous Every 8 hours 12/10/14 1717     12/10/14 1200  piperacillin-tazobactam (ZOSYN) IVPB 3.375 g  Status:  Discontinued     3.375 g12.5 mL/hr over 240 Minutes Intravenous Every 8 hours 12/10/14 1059 12/10/14 1717   12/10/14 1200  vancomycin (VANCOCIN) IVPB 1000 mg/200 mL premix  Status:  Discontinued     1,000 mg200 mL/hr over 60 Minutes Intravenous Every 12 hours 12/10/14 1059 12/10/14 1717   12/10/14 1100  piperacillin-tazobactam (ZOSYN) IVPB 3.375 g  Status:  Discontinued     3.375 g100 mL/hr over 30 Minutes Intravenous  Once 12/10/14 1047 12/10/14 1100   12/10/14 1100  vancomycin (VANCOCIN) IVPB 1000 mg/200 mL premix  Status:  Discontinued     1,000 mg200 mL/hr over 60 Minutes Intravenous  Once 12/10/14 1047 12/10/14 1100   12/09/14 2245  vancomycin (VANCOCIN) IVPB 1000 mg/200 mL premix     1,000 mg200 mL/hr over 60 Minutes Intravenous  Once 12/09/14 2243 12/10/14 0006   12/09/14 2245  piperacillin-tazobactam (ZOSYN) IVPB 3.375 g     3.375 g100 mL/hr over 30 Minutes Intravenous  Once 12/09/14 2243 12/10/14 0313      Assessment: 48 year old male with recent IV drug use. Now presents with pain at injection site. Found to have R forearm deep tissue abscess with pyomyositis. Went to  the OR again yesterday and is s/p I&D x 2. Pt missed a dose of vancomycin yesterday. Gram stain shows polymicrobial infection with GPC in clusters and pairs with GNRs. Wound cx shows moderate GNRs and blood cx shows ngtd. Ortho recommends continuing current IV abx.  Goal of Therapy:  Vancomycin trough level 10-15 mcg/ml  Eradication of infection  Plan:  Continue Vancomycin 1g IV Q12 Continue Zosyn 3.375g IV Q8 Monitor renal function, clinical picture, VT at Css Plan to deescalate when C&S finalized Follow up LOT, deescalation  Armandina StammerBATCHELDER,Kerrilynn Derenzo J  12/13/2014,11:28 AM

## 2014-12-14 ENCOUNTER — Encounter (HOSPITAL_COMMUNITY): Payer: Self-pay | Admitting: Orthopedic Surgery

## 2014-12-14 LAB — BASIC METABOLIC PANEL
ANION GAP: 9 (ref 5–15)
BUN: 11 mg/dL (ref 6–23)
CO2: 22 mmol/L (ref 19–32)
Calcium: 8.9 mg/dL (ref 8.4–10.5)
Chloride: 107 mEq/L (ref 96–112)
Creatinine, Ser: 0.87 mg/dL (ref 0.50–1.35)
GFR calc Af Amer: >90 mL/min (ref >90)
GFR calc non Af Amer: >90 mL/min (ref >90)
GLUCOSE: 104 mg/dL — AB (ref 70–99)
Potassium: 4.3 mmol/L (ref 3.5–5.1)
SODIUM: 138 mmol/L (ref 135–145)

## 2014-12-14 LAB — CBC
HEMATOCRIT: 39 % (ref 39.0–52.0)
Hemoglobin: 12.9 g/dL — ABNORMAL LOW (ref 13.0–17.0)
MCH: 29.7 pg (ref 26.0–34.0)
MCHC: 33.1 g/dL (ref 30.0–36.0)
MCV: 89.7 fL (ref 78.0–100.0)
Platelets: 460 10*3/uL — ABNORMAL HIGH (ref 150–400)
RBC: 4.35 MIL/uL (ref 4.22–5.81)
RDW: 13 % (ref 11.5–15.5)
WBC: 7.3 10*3/uL (ref 4.0–10.5)

## 2014-12-14 LAB — WOUND CULTURE

## 2014-12-14 LAB — GLUCOSE, CAPILLARY: GLUCOSE-CAPILLARY: 91 mg/dL (ref 70–99)

## 2014-12-14 NOTE — Progress Notes (Signed)
Physical Therapy Wound Treatment Patient Details  Name: Christopher [name] MRN: [id number] Date of Birth: 11-12-1966  Today's Date: 12/14/2014 Time: 1220-1251 Time Calculation (min): 31 min  Subjective  Subjective: Pt states he hasn't seen his arm yet and doesn't remember his 2 surgeries. Patient and Family Stated Goals: get better Date of Onset: 12/08/14 Prior Treatments: I&D x 2  Pain Score:    Wound Assessment  Wound / Incision (Open or Dehisced) 12/10/14 Arm Right (Active)  Dressing Type Compression wrap;Gauze (Comment);Impregnated gauze (bismuth);Moist to dry 12/14/2014  2:33 PM  Dressing Changed Changed 12/14/2014  2:33 PM  Dressing Status Clean;Dry;Intact 12/14/2014  2:33 PM  Dressing Change Frequency Daily 12/14/2014  2:33 PM  Site / Wound Assessment Pink;Red;Yellow 12/14/2014  2:33 PM  % Wound base Red or Granulating 95% 12/14/2014  2:33 PM  % Wound base Yellow 5% 12/14/2014  2:33 PM  % Wound base Black 0% 12/14/2014  2:33 PM  % Wound base Other (Comment) 0% 12/14/2014  2:33 PM  Peri-wound Assessment Edema 12/14/2014  2:33 PM  Wound Length (cm) 15 cm 12/14/2014  2:33 PM  Wound Width (cm) 1.5 cm 12/14/2014  2:33 PM  Wound Depth (cm) 0.5 cm 12/14/2014  2:33 PM  Undermining (cm) 2 cm from 12 oclock to 3 oclock 12/14/2014  2:33 PM  Margins Unattached edges (unapproximated) 12/14/2014  2:33 PM  Closure Sutures 12/14/2014  2:33 PM  Drainage Amount Moderate 12/14/2014  2:33 PM  Drainage Description Serosanguineous 12/14/2014  2:33 PM  Treatment Hydrotherapy (Pulse lavage);Packing (Saline gauze) 12/14/2014  2:33 PM      Hydrotherapy Pulsed lavage therapy - wound location: rt forearm Pulsed Lavage with Suction (psi): 4 psi Pulsed Lavage with Suction - Normal Saline Used: 1000 mL   Wound Assessment and Plan     Wound Therapy Goals- Improve the function of patient's integumentary system by progressing the wound(s) through the phases of wound healing (inflammation -  proliferation - remodeling) by: Decrease Necrotic Tissue to: 0% Decrease Necrotic Tissue - Progress: Goal set today Increase Granulation Tissue to: 100%  Goals will be updated until maximal potential achieved or discharge criteria met.  Discharge criteria: when goals achieved, discharge from hospital, MD decision/surgical intervention, no progress towards goals, refusal/missing three consecutive treatments without notification or medical reason.  GP     Kuuipo Anzaldo 12/14/2014, 2:37 PM  [facility] PT [phone]

## 2014-12-14 NOTE — Progress Notes (Signed)
[facility] PROGRESS NOTE   DAVISON OHMS ZOX:[id number] DOB: Nov 09, 1966 DOA: 12/09/2014 PCP: Rudi Heap, MD  HPI/Subjective: I&D culture grew Enterobacter, await recommendation from orthopedics.  Assessment/Plan: Principal Problem:   Cellulitis of right arm Active Problems:   Abscess of arm, right   Hyperglycemia   IV drug user   Hyponatremia   Pyomyositis   Right forearm deep tissue abscess with pyomyositis Secondary to cellulitis and abscess and resultant pyomyositis. This is started after self injection with IV drugs. Status post I&D2 by Dr. Mina Marble of orthopedics, recommends tocontinue IV antibiotics. Gram stain is showing polymicrobial infection with GPC in clusters and pairs and GNRs. WBCs back to normal, culture grew Enterobacter cloacae. Await Dr. Mina Marble recommendation, can be discharged from medical standpoint.  Right arm cellulitis/pyomyositis Continue IV antibiotics. Continue to control pain with narcotics. Elevate right upper extremity   Hyperglycemia Blood sugar was up to 156, is likely secondary to stress reaction from infection. A1c is 5.5, no indication for hypoglycemic agents.  Sepsis Early sepsis with WBC of 21.5, heart rate of 102 and presence of right upper extremity cellulitis. Patient hydrated with IV fluids, continue IV antibiotics.  IV drug use Patient counseled extensively.  Code Status: Full code Family Communication: Plan discussed with the patient. Disposition Plan: Remains inpatient   Consultants:  Orthopedics  Procedures:  Incision and drainage of proximal forearm dorsal deep abscess done by Dr. Mina Marble. On 12/10/2014.  Antibiotics:  Zosyn and vancomycin   Objective: Filed Vitals:   12/14/14 0610  BP: 109/84  Pulse: 74  Temp: 98 F (36.7 C)  Resp: 18    Intake/Output Summary (Last 24 hours) at 12/14/14 1231 Last data filed at 12/13/14 1700  Gross per 24 hour  Intake    240 ml  Output      0  ml  Net    240 ml   There were no vitals filed for this visit.  Exam: General: Alert and awake, oriented x3, not in any acute distress. HEENT: anicteric sclera, pupils reactive to light and accommodation, EOMI CVS: S1-S2 clear, no murmur rubs or gallops Chest: clear to auscultation bilaterally, no wheezing, rales or rhonchi Abdomen: soft nontender, nondistended, normal bowel sounds, no organomegaly Extremities: no cyanosis, clubbing or edema noted bilaterally Neuro: Cranial nerves II-XII intact, no focal neurological deficits  Data Reviewed: Basic Metabolic Panel:  Recent Labs Lab 12/09/14 2218 12/10/14 1120 12/11/14 0523 12/12/14 0505 12/13/14 0550 12/14/14 1047  NA 131*  --  137 136 137 138  K 4.2  --  4.0 4.2 3.8 4.3  CL 97  --  104 104 104 107  CO2 24  --  27 26 23 22   GLUCOSE 156*  --  149* 100* 163* 104*  BUN 10  --  12 10 10 11   CREATININE 1.06 1.00 1.10 0.88 1.01 0.87  CALCIUM 8.9  --  8.3* 8.5 8.7 8.9   Liver Function Tests:  Recent Labs Lab 12/09/14 2218  AST 41*  ALT 40  ALKPHOS 113  BILITOT 0.8  PROT 7.2  ALBUMIN 3.4*   No results for input(s): LIPASE, AMYLASE in the last 168 hours. No results for input(s): AMMONIA in the last 168 hours. CBC:  Recent Labs Lab 12/09/14 2218 12/10/14 1120 12/11/14 0523 12/12/14 0505 12/13/14 0550 12/14/14 1047  WBC 21.5* 20.3* 17.5* 11.5* 11.5* 7.3  NEUTROABS 18.4*  --   --   --   --   --   HGB 14.5 13.5 12.9* 12.7* 12.7*  12.9*  HCT 43.1 40.5 39.5 38.8* 37.9* 39.0  MCV 88.7 88.6 89.0 89.0 89.6 89.7  PLT 312 288 284 341 407* 460*   Cardiac Enzymes: No results for input(s): CKTOTAL, CKMB, CKMBINDEX, TROPONINI in the last 168 hours. BNP (last 3 results) No results for input(s): PROBNP in the last 8760 hours. CBG:  Recent Labs Lab 12/12/14 1620 12/12/14 2135 12/13/14 0637 12/13/14 2149 12/14/14 0611  GLUCAP 109* 126* 136* 166* 91    Micro Recent Results (from the past 240 hour(s))  Culture,  blood (routine x 2)     Status: None (Preliminary result)   Collection Time: 12/09/14 10:55 PM  Result Value Ref Range Status   Specimen Description BLOOD FOREARM LEFT  Final   Special Requests BOTTLES DRAWN AEROBIC AND ANAEROBIC 5CC  Final   Culture  Setup Time   Final    12/10/2014 05:06 Performed at [facility]    Culture   Final           BLOOD CULTURE RECEIVED NO GROWTH TO DATE CULTURE WILL BE HELD FOR 5 DAYS BEFORE ISSUING A FINAL NEGATIVE REPORT Performed at [facility]    Report Status PENDING  Incomplete  Culture, blood (routine x 2)     Status: None (Preliminary result)   Collection Time: 12/09/14 10:57 PM  Result Value Ref Range Status   Specimen Description BLOOD RIGHT HAND  Final   Special Requests BOTTLES DRAWN AEROBIC ONLY 4CC  Final   Culture  Setup Time   Final    12/10/2014 05:05 Performed at [facility]    Culture   Final           BLOOD CULTURE RECEIVED NO GROWTH TO DATE CULTURE WILL BE HELD FOR 5 DAYS BEFORE ISSUING A FINAL NEGATIVE REPORT Performed at [facility]    Report Status PENDING  Incomplete  Surgical pcr screen     Status: None   Collection Time: 12/10/14 11:47 AM  Result Value Ref Range Status   MRSA, PCR NEGATIVE NEGATIVE Final   Staphylococcus aureus NEGATIVE NEGATIVE Final    Comment:        The Xpert SA Assay (FDA approved for NASAL specimens in patients over 47 years of age), is one component of a comprehensive surveillance program.  Test performance has been validated by [facility] for patients greater than or equal to 47 year old. It is not intended to diagnose infection nor to guide or monitor treatment.   Anaerobic culture     Status: None (Preliminary result)   Collection Time: 12/10/14  1:27 PM  Result Value Ref Range Status   Specimen Description WOUND RIGHT ARM  Final   Special Requests NONE  Final   Gram Stain   Final    ABUNDANT WBC PRESENT,BOTH PMN AND MONONUCLEAR NO SQUAMOUS  EPITHELIAL CELLS SEEN ABUNDANT GRAM POSITIVE COCCI IN PAIRS IN CLUSTERS ABUNDANT GRAM NEGATIVE RODS Performed at [facility]    Culture   Final    NO ANAEROBES ISOLATED; CULTURE IN PROGRESS FOR 5 DAYS Performed at [facility]    Report Status PENDING  Incomplete  Wound culture     Status: None   Collection Time: 12/10/14  1:27 PM  Result Value Ref Range Status   Specimen Description WOUND RIGHT ARM  Final   Special Requests NONE  Final   Gram Stain   Final    FEW WBC PRESENT,BOTH PMN AND MONONUCLEAR NO SQUAMOUS EPITHELIAL CELLS SEEN  ABUNDANT GRAM POSITIVE COCCI IN PAIRS IN CLUSTERS MODERATE GRAM NEGATIVE RODS    Culture   Final    FEW ENTEROBACTER CLOACAE MODERATE STREPTOCOCCUS GROUP C    Report Status 12/14/2014 FINAL  Final   Organism ID, Bacteria ENTEROBACTER CLOACAE  Final      Susceptibility   Enterobacter cloacae - MIC*    CEFAZOLIN >=64 RESISTANT Resistant     CEFEPIME <=1 SENSITIVE Sensitive     CEFTAZIDIME <=1 SENSITIVE Sensitive     CEFTRIAXONE <=1 SENSITIVE Sensitive     CIPROFLOXACIN <=0.25 SENSITIVE Sensitive     GENTAMICIN <=1 SENSITIVE Sensitive     IMIPENEM <=0.25 SENSITIVE Sensitive     TOBRAMYCIN <=1 SENSITIVE Sensitive     TRIMETH/SULFA Value in next row Sensitive      <=20 SENSITIVEPerformed at [facility]    * FEW ENTEROBACTER CLOACAE  Gram stain     Status: None   Collection Time: 12/10/14  1:27 PM  Result Value Ref Range Status   Specimen Description WOUND RIGHT ARM  Final   Special Requests NONE  Final   Gram Stain   Final    ABUNDANT WBC PRESENT, PREDOMINANTLY PMN ABUNDANT GRAM NEGATIVE RODS ABUNDANT GRAM POSITIVE COCCI IN PAIRS Gram Stain Report Called to,Read Back By and Verified With: DR Mina MarbleWEINGOLD AT 1402 12/10/14 BY K BARR    Report Status 12/10/2014 FINAL  Final     Studies: No results found.  Scheduled Meds: . heparin  5,000 Units Subcutaneous 3 times per day  . nicotine  21 mg Transdermal Daily  .  piperacillin-tazobactam (ZOSYN)  IV  3.375 g Intravenous Q8H  . sodium chloride  3 mL Intravenous Q12H  . vancomycin  1,000 mg Intravenous Q12H   Continuous Infusions: . lactated ringers 10 mL/hr at 12/10/14 1232       Time spent: 35 minutes    [facility] PennsylvaniaELMAHI,Hermela Hardt A  [facility] Pager [phone] If 7PM-7AM, please contact night-coverage at [email or web address], password [email or web address] 12/14/2014, 12:31 PM  LOS: 5 days

## 2014-12-15 LAB — GLUCOSE, CAPILLARY
GLUCOSE-CAPILLARY: 79 mg/dL (ref 70–99)
Glucose-Capillary: 76 mg/dL (ref 70–99)

## 2014-12-15 LAB — ANAEROBIC CULTURE

## 2014-12-15 MED ORDER — LEVOFLOXACIN 750 MG PO TABS: 750.0000 mg | ORAL_TABLET | Freq: Every day | ORAL | Status: DC

## 2014-12-15 MED ORDER — OXYCODONE-ACETAMINOPHEN 5-325 MG PO TABS: 1.0000 | ORAL_TABLET | ORAL | Status: DC | PRN

## 2014-12-15 NOTE — Progress Notes (Signed)
Physical Therapy Wound Treatment Patient Details  Name: Christopher [name] MRN: [id number] Date of Birth: [birth year]  Today's Date: 12/15/2014 Time: 1130-1203 Time Calculation (min): 33 min  Subjective  Subjective: I'm just hoping to get out of here today. Patient and Family Stated Goals: get better Date of Onset: 12/08/14 Prior Treatments: I&D x 2  Pain Score: Pain Score: 4   Wound Assessment  Wound / Incision (Open or Dehisced) 12/10/14 Arm Right (Active)  Dressing Type Compression wrap;Gauze (Comment);Impregnated gauze (bismuth);Moist to dry 12/15/2014 12:30 PM  Dressing Changed Changed 12/15/2014 12:30 PM  Dressing Status Clean;Dry;Intact 12/15/2014 12:30 PM  Dressing Change Frequency Daily 12/15/2014 12:30 PM  Site / Wound Assessment Dressing in place / Unable to assess 12/14/2014  8:00 PM  % Wound base Red or Granulating 95% 12/14/2014  2:33 PM  % Wound base Yellow 5% 12/14/2014  2:33 PM  % Wound base Black 0% 12/15/2014 12:30 PM  % Wound base Other (Comment) 0% 12/15/2014 12:30 PM  Peri-wound Assessment Edema 12/15/2014 12:30 PM  Wound Length (cm) 15 cm 12/14/2014  2:33 PM  Wound Width (cm) 1.5 cm 12/14/2014  2:33 PM  Wound Depth (cm) 0.5 cm 12/14/2014  2:33 PM  Undermining (cm) 2 cm from 12 oclock to 3 oclock 12/14/2014  2:33 PM  Margins Unattached edges (unapproximated) 12/15/2014 12:30 PM  Closure Sutures 12/15/2014 12:30 PM  Drainage Amount Moderate 12/15/2014 12:30 PM  Drainage Description Serosanguineous 12/15/2014 12:30 PM  Treatment Cleansed;Debridement (Selective);Hydrotherapy (Pulse lavage);Packing (Impregnated strip) 12/15/2014 12:30 PM     Incision (Closed) 12/10/14 Arm Right (Active)  Dressing Type Compression wrap;Gauze (Comment) 12/15/2014  8:00 AM  Dressing Clean;Dry 12/15/2014  8:00 AM  Site / Wound Assessment Dressing in place / Unable to assess 12/10/2014  5:04 PM  Drainage Amount Other (Comment) 12/12/2014  8:41 AM  Drainage Description  Serosanguineous 12/10/2014  8:20 PM     Incision (Closed) 12/12/14 Arm (Active)  Dressing Type Compression wrap 12/15/2014  8:00 AM  Dressing Clean;Dry;Intact 12/15/2014  8:00 AM  Site / Wound Assessment Dressing in place / Unable to assess 12/14/2014  8:00 PM  Drainage Amount None 12/14/2014  8:00 PM  Treatment Other (Comment) 12/14/2014  7:20 AM       Wound Assessment and Plan  Wound Therapy - Assess/Plan/Recommendations Wound Therapy - Clinical Statement: pt has benefited for PLS and selective debridement with the packing strip to keep  skin from adhering while acting as a wick.  Expect pt will be ready for closure  in a short while. Factors Delaying/Impairing Wound Healing: Substance abuse Hydrotherapy Plan: Debridement;Dressing change;Patient/family education;Pulsatile lavage with suction Wound Therapy - Frequency: 6X / week Wound Therapy - Follow Up Recommendations: Other (comment) (surgury follow up.)  Wound Therapy Goals- Improve the function of patient's integumentary system by progressing the wound(s) through the phases of wound healing (inflammation - proliferation - remodeling) by: Decrease Necrotic Tissue to: 0% Decrease Necrotic Tissue - Progress: Progressing toward goal Increase Granulation Tissue to: 100% Increase Granulation Tissue - Progress: Progressing toward goal Goals/treatment plan/discharge plan were made with and agreed upon by patient/family: Yes Time For Goal Achievement: 7 days Wound Therapy - Potential for Goals: Good  Goals will be updated until maximal potential achieved or discharge criteria met.  Discharge criteria: when goals achieved, discharge from hospital, MD decision/surgical intervention, no progress towards goals, refusal/missing three consecutive treatments without notification or medical reason.  GP     [name], Tessie Fass 12/15/2014, 12:35 PM 12/15/2014  Donnella Sham, PT  [phone]  (pager)

## 2014-12-15 NOTE — Progress Notes (Signed)
D/C instructions and scripts given to Pt. Spoke to Dr Mina MarbleWeingold. Pt is to follow up with him in the am and have hydrotherapy in the office tomorrow. Notified Pt of the plan and to call the office to make the appointment. D/Cd IV and a friend is at the bedside to take Pt home at this time.

## 2014-12-15 NOTE — Discharge Summary (Signed)
Physician Discharge Summary  Christopher [name] ZOX:[id number] DOB: 1966/03/21 DOA: 12/09/2014  PCP: Christopher Heap, MD  Admit date: 12/09/2014 Discharge date: 12/15/2014  Time spent: 40 minutes  Recommendations for Outpatient Follow-up:  1. Follow-up with Christopher [name] tomorrow for wound check  Discharge Diagnoses:  Principal Problem:   Cellulitis of right arm Active Problems:   Abscess of arm, right   Hyperglycemia   IV drug user   Hyponatremia   Pyomyositis   Discharge Condition: Stable  Diet recommendation: Regular  There were no vitals filed for this visit.  History of present illness:  Christopher [name] is a 48 y.o. male  With recent history of IV drug use 2 days ago. Presents with pain at injection site. Since onset 2 days ago has progressively gotten worse. Nothing he is aware of makes it better. Movement or touching the site makes it more painful. The problem has been persistent since onset.   While in the ED patient had MRI of right arm which revealed abscess and cellulitis. Orthopedic hand specialist consulted. And we were consulted for further medical evaluation recommendations.  Hospital Course:    Right forearm deep tissue abscess with pyomyositis Secondary to cellulitis/abscess and resultant pyomyositis. This is started after self injection with IV drugs. Status post I&D2 by Christopher [name] of orthopedics, recommends tocontinue IV antibiotics. Gram stain is showing polymicrobial infection with GPC in clusters and pairs and GNRs. WBCs back to normal, culture grew Enterobacter cloacae. Patient was on vancomycin and Zosyn, discharged on levofloxacin 750 mg for 5 more days.  Right arm cellulitis/pyomyositis Continue IV antibiotics. Continue to control pain with narcotics. Elevate right upper extremity. Percocet 04/19/24/1930 pills prescribed for the patient.  Hyperglycemia Blood sugar was up to 156, is likely secondary to stress reaction from infection. A1c  is 5.5, no indication for hypoglycemic agents.  Sepsis Early sepsis with WBC of 21.5, heart rate of 102 and presence of right upper extremity cellulitis. Patient hydrated with IV fluids, continue IV antibiotics. Sepsis physiology resolved, leukocytosis resolved.  IV drug use Patient counseled extensively.   Procedures:  Incision and drainage of the right forearm in the OR 2.  Consultations:  Christopher [name] of orthopedics  Discharge Exam: Filed Vitals:   12/15/14 0627  BP: 119/69  Pulse: 70  Temp: 98 F (36.7 C)  Resp:    General: Alert and awake, oriented x3, not in any acute distress. HEENT: anicteric sclera, pupils reactive to light and accommodation, EOMI CVS: S1-S2 clear, no murmur rubs or gallops Chest: clear to auscultation bilaterally, no wheezing, rales or rhonchi Abdomen: soft nontender, nondistended, normal bowel sounds, no organomegaly Extremities: no cyanosis, clubbing or edema noted bilaterally Neuro: Cranial nerves II-XII intact, no focal neurological deficits   Discharge Instructions   Discharge Instructions    Increase activity slowly    Complete by:  As directed           Current Discharge Medication List    START taking these medications   Details  levofloxacin (LEVAQUIN) 750 MG tablet Take 1 tablet (750 mg total) by mouth daily. Qty: 5 tablet, Refills: 0    oxyCODONE-acetaminophen (ROXICET) 5-325 MG per tablet Take 1 tablet by mouth every 4 (four) hours as needed. Qty: 30 tablet, Refills: 0      STOP taking these medications     ibuprofen (ADVIL,MOTRIN) 200 MG tablet        No Known Allergies Follow-up Information    Follow up with Christopher Shores, MD On 12/16/2014.  Specialty:  Orthopedic Surgery   Why:  For wound check   Contact information:   [address] [place] [address] [phone]        The results of significant diagnostics from this hospitalization (including imaging, microbiology, ancillary and  laboratory) are listed below for reference.    Significant Diagnostic Studies: Dg Eye Foreign Body  12/10/2014   CLINICAL DATA:  Metal working/exposure; clearance prior to MRI  EXAM: ORBITS FOR FOREIGN BODY - 2 VIEW  COMPARISON:  None.  FINDINGS: There is no evidence of metallic foreign body within the orbits. No significant bone abnormality identified.  IMPRESSION: No evidence of metallic foreign body within the orbits.   Electronically Signed   By: Christiana PellantGretchen  Green M.D.   On: 12/10/2014 01:56   Mr Forearm Right W/o Cm  12/10/2014   CLINICAL DATA:  Pt injected either "molly" or meth into his arm yesterday and since then is having severe pain, redness, and edema to rt arm. Pt attempted twice with pain meds, unable to complete full exam due to pain. Tried multiple positions to accommodate. Unable to scan humerus or give contrast.  EXAM: MRI OF THE RIGHT FOREARM WITHOUT CONTRAST  TECHNIQUE: Multiplanar, multisequence MR imaging was performed. No intravenous contrast was administered.  COMPARISON:  None.  FINDINGS: Limited examination as the examination could not be completed in its totality. There is also patient motion which degrades image quality.  There is severe muscle edema in the brachioradialis muscle and extensor carpi radialis longus muscle. There is a 3.5 x 7.1 x 6 cm fluid collection within the brachioradialis muscle. There is abnormal signal within the musculotendinous junction of the brachioradialis. There is severe soft tissue edema around the right elbow extending into the forearm to the level of the wrist. There are no low signal foci to suggest a tear.  The remainder of the muscle groups are normal in signal. There is no marrow signal abnormality. No fracture or dislocation. There is no periostitis. There is no definite elbow joint effusion.  IMPRESSION: 1. Severe muscle edema in the brachioradialis muscle and extensor carpi the radialis longus muscle most consistent with myositis. 3.5 x 7.1 x  6 cm fluid collection within the brachioradialis muscle which may reflect muscle necrosis versus abscess. Circumferential edema around the entirety of the forearm most severe along the radial aspect most concerning for cellulitis. 2. Tendinosis versus partial tear of the brachioradialis tendon at the musculotendinous junction.   Electronically Signed   By: Elige KoHetal  Patel   On: 12/10/2014 08:40    Microbiology: Recent Results (from the past 240 hour(s))  Culture, blood (routine x 2)     Status: None (Preliminary result)   Collection Time: 12/09/14 10:55 PM  Result Value Ref Range Status   Specimen Description BLOOD FOREARM LEFT  Final   Special Requests BOTTLES DRAWN AEROBIC AND ANAEROBIC 5CC  Final   Culture  Setup Time   Final    12/10/2014 05:06 Performed at [facility]    Culture   Final           BLOOD CULTURE RECEIVED NO GROWTH TO DATE CULTURE WILL BE HELD FOR 5 DAYS BEFORE ISSUING A FINAL NEGATIVE REPORT Performed at [facility]    Report Status PENDING  Incomplete  Culture, blood (routine x 2)     Status: None (Preliminary result)   Collection Time: 12/09/14 10:57 PM  Result Value Ref Range Status   Specimen Description BLOOD RIGHT HAND  Final  Special Requests BOTTLES DRAWN AEROBIC ONLY 4CC  Final   Culture  Setup Time   Final    12/10/2014 05:05 Performed at [facility]    Culture   Final           BLOOD CULTURE RECEIVED NO GROWTH TO DATE CULTURE WILL BE HELD FOR 5 DAYS BEFORE ISSUING A FINAL NEGATIVE REPORT Performed at [facility]    Report Status PENDING  Incomplete  Surgical pcr screen     Status: None   Collection Time: 12/10/14 11:47 AM  Result Value Ref Range Status   MRSA, PCR NEGATIVE NEGATIVE Final   Staphylococcus aureus NEGATIVE NEGATIVE Final    Comment:        The Xpert SA Assay (FDA approved for NASAL specimens in patients over 85 years of age), is one component of a comprehensive surveillance program.  Test  performance has been validated by [employer] for patients greater than or equal to 80 year old. It is not intended to diagnose infection nor to guide or monitor treatment.   Anaerobic culture     Status: None   Collection Time: 12/10/14  1:27 PM  Result Value Ref Range Status   Specimen Description WOUND RIGHT ARM  Final   Special Requests NONE  Final   Gram Stain   Final    ABUNDANT WBC PRESENT,BOTH PMN AND MONONUCLEAR NO SQUAMOUS EPITHELIAL CELLS SEEN ABUNDANT GRAM POSITIVE COCCI IN PAIRS IN CLUSTERS ABUNDANT GRAM NEGATIVE RODS Performed at [facility]    Culture   Final    NO ANAEROBES ISOLATED Performed at [facility]    Report Status 12/15/2014 FINAL  Final  Wound culture     Status: None   Collection Time: 12/10/14  1:27 PM  Result Value Ref Range Status   Specimen Description WOUND RIGHT ARM  Final   Special Requests NONE  Final   Gram Stain   Final    FEW WBC PRESENT,BOTH PMN AND MONONUCLEAR NO SQUAMOUS EPITHELIAL CELLS SEEN ABUNDANT GRAM POSITIVE COCCI IN PAIRS IN CLUSTERS MODERATE GRAM NEGATIVE RODS    Culture   Final    FEW ENTEROBACTER CLOACAE MODERATE STREPTOCOCCUS GROUP C    Report Status 12/14/2014 FINAL  Final   Organism ID, Bacteria ENTEROBACTER CLOACAE  Final      Susceptibility   Enterobacter cloacae - MIC*    CEFAZOLIN >=64 RESISTANT Resistant     CEFEPIME <=1 SENSITIVE Sensitive     CEFTAZIDIME <=1 SENSITIVE Sensitive     CEFTRIAXONE <=1 SENSITIVE Sensitive     CIPROFLOXACIN <=0.25 SENSITIVE Sensitive     GENTAMICIN <=1 SENSITIVE Sensitive     IMIPENEM <=0.25 SENSITIVE Sensitive     TOBRAMYCIN <=1 SENSITIVE Sensitive     TRIMETH/SULFA Value in next row Sensitive      <=20 SENSITIVEPerformed at [facility]    * FEW ENTEROBACTER CLOACAE  Gram stain     Status: None   Collection Time: 12/10/14  1:27 PM  Result Value Ref Range Status   Specimen Description WOUND RIGHT ARM  Final   Special Requests NONE  Final    Gram Stain   Final    ABUNDANT WBC PRESENT, PREDOMINANTLY PMN ABUNDANT GRAM NEGATIVE RODS ABUNDANT GRAM POSITIVE COCCI IN PAIRS Gram Stain Report Called to,Read Back By and Verified With: DR [facility] AT 1402 12/10/14 BY K BARR    Report Status 12/10/2014 FINAL  Final     Labs: Basic Metabolic Panel:  Recent  Labs Lab 12/09/14 2218 12/10/14 1120 12/11/14 0523 12/12/14 0505 12/13/14 0550 12/14/14 1047  NA 131*  --  137 136 137 138  K 4.2  --  4.0 4.2 3.8 4.3  CL 97  --  104 104 104 107  CO2 24  --  27 26 23 22   GLUCOSE 156*  --  149* 100* 163* 104*  BUN 10  --  12 10 10 11   CREATININE 1.06 1.00 1.10 0.88 1.01 0.87  CALCIUM 8.9  --  8.3* 8.5 8.7 8.9   Liver Function Tests:  Recent Labs Lab 12/09/14 2218  AST 41*  ALT 40  ALKPHOS 113  BILITOT 0.8  PROT 7.2  ALBUMIN 3.4*   No results for input(s): LIPASE, AMYLASE in the last 168 hours. No results for input(s): AMMONIA in the last 168 hours. CBC:  Recent Labs Lab 12/09/14 2218 12/10/14 1120 12/11/14 0523 12/12/14 0505 12/13/14 0550 12/14/14 1047  WBC 21.5* 20.3* 17.5* 11.5* 11.5* 7.3  NEUTROABS 18.4*  --   --   --   --   --   HGB 14.5 13.5 12.9* 12.7* 12.7* 12.9*  HCT 43.1 40.5 39.5 38.8* 37.9* 39.0  MCV 88.7 88.6 89.0 89.0 89.6 89.7  PLT 312 288 284 341 407* 460*   Cardiac Enzymes: No results for input(s): CKTOTAL, CKMB, CKMBINDEX, TROPONINI in the last 168 hours. BNP: BNP (last 3 results) No results for input(s): PROBNP in the last 8760 hours. CBG:  Recent Labs Lab 12/13/14 0637 12/13/14 2149 12/14/14 0611 12/15/14 0051 12/15/14 0649  GLUCAP 136* 166* 91 76 79       Signed:  Sarahlynn Cisnero A  [facility] 12/15/2014, 10:41 AM

## 2014-12-16 LAB — CULTURE, BLOOD (ROUTINE X 2)
CULTURE: NO GROWTH
Culture: NO GROWTH

## 2015-05-12 ENCOUNTER — Encounter (HOSPITAL_COMMUNITY): Payer: Self-pay | Admitting: Emergency Medicine

## 2015-05-12 ENCOUNTER — Emergency Department (HOSPITAL_COMMUNITY)
Admission: EM | Admit: 2015-05-12 | Discharge: 2015-05-13 | Disposition: A | Discharge status: Other Acute Inpatient Hospital | Payer: MEDICAID | Attending: Emergency Medicine | Admitting: Emergency Medicine

## 2015-05-12 DIAGNOSIS — F1110 Opioid abuse, uncomplicated: Secondary | ICD-10-CM

## 2015-05-12 DIAGNOSIS — F1410 Cocaine abuse, uncomplicated: Secondary | ICD-10-CM | POA: Diagnosis not present

## 2015-05-12 DIAGNOSIS — Z79899 Other long term (current) drug therapy: Secondary | ICD-10-CM | POA: Insufficient documentation

## 2015-05-12 DIAGNOSIS — Z720 Tobacco use: Secondary | ICD-10-CM | POA: Insufficient documentation

## 2015-05-12 HISTORY — DX: Opioid dependence, uncomplicated: F11.20

## 2015-05-12 HISTORY — DX: Other psychoactive substance abuse, uncomplicated: F19.10

## 2015-05-12 NOTE — ED Notes (Signed)
Pt. requesting detox from opiates ( Percocet) and Heroin addiction , last Heroin 2 days ago , alert and oriented / respirations unlabored.

## 2015-05-12 NOTE — ED Provider Notes (Signed)
CSN: [id number]     Arrival date & time 05/12/15  2026 History  This chart was scribed for Tomasita Crumble, MD by Annye Asa, ED Scribe. This patient was seen in room A05C/A05C and the patient's care was started at 12:15 AM.    Chief Complaint  Patient presents with  . Drug Problem   The history is provided by the patient and a friend. No language interpreter was used.    HPI Comments: Christopher [name] is a 49 y.o. male who presents to the Emergency Department for a drug problem. Patient  is requesting detox from heroin; last use yesterday via injection. He currently complains of mild withdrawal symptoms; he notes generalized myalgias with mild nausea. He also notes rhinorrhea, cough. He has never before gone through detox. Companion in room states that there is a bed waiting at [facility]; the facility would like documentation from the hospital before admitting this patient.    He is a .5ppd smoker and occasional EtOH user.   Past Medical History  Diagnosis Date  . IVDU (intravenous drug user)   . Polysubstance abuse   . Heroin addiction    Past Surgical History  Procedure Laterality Date  . Incision and drainage abscess Right 12/10/2014    Proximal forearm dorsal deep abscess.  . Ankle fracture surgery Left 2012  . Fracture surgery    . I&d extremity Right 12/10/2014    Procedure: IRRIGATION AND DEBRIDEMENT EXTREMITY;  Surgeon: Dairl Ponder, MD;  [facility] OR;  Service: Orthopedics;  Laterality: Right;  . I&d extremity Right 12/12/2014    Procedure: IRRIGATION AND DEBRIDEMENT EXTREMITY;  Surgeon: Dairl Ponder, MD;  Location: [facility] OR;  Service: Orthopedics;  Laterality: Right;  forearm   Family History  Problem Relation Age of Onset  . Liver disease Mother   . Heart disease Father    History  Substance Use Topics  . Smoking status: Current Some Day Smoker -- 0.50 packs/day for 30 years    Types: Cigarettes  . Smokeless tobacco: Never Used  .  Alcohol Use: Yes     Comment: 12/10/2014 "used to drink considerable; hardly drink at all anymore"    Review of Systems  All other systems reviewed and are negative.     Allergies  Review of patient's allergies indicates no known allergies.  Home Medications   Prior to Admission medications   Medication Sig Start Date End Date Taking? Authorizing Provider  gabapentin (NEURONTIN) 300 MG capsule Take 300-900 mg by mouth at bedtime.  04/13/15  Yes Historical Provider, MD  oxyCODONE-acetaminophen (PERCOCET) 7.5-325 MG per tablet Take 1 tablet by mouth 3 (three) times daily. 04/19/15  Yes Historical Provider, MD  OXYCONTIN 10 MG T12A 12 hr tablet Take 10 mg by mouth at bedtime. 04/22/15  Yes Historical Provider, MD   BP 129/72 mmHg  Pulse 91  Temp(Src) 98 F (36.7 C) (Oral)  Resp 16  SpO2 97% Physical Exam  Constitutional: He is oriented to person, place, and time. He appears well-developed and well-nourished. No distress.  HENT:  Head: Normocephalic and atraumatic.  Mouth/Throat: Oropharynx is clear and moist. No oropharyngeal exudate.  Moist mucous membranes  Eyes: EOM are normal. Pupils are equal, round, and reactive to light.  Pinpoint pupils  Neck: Normal range of motion. Neck supple. No JVD present.  Cardiovascular: Normal rate, regular rhythm and normal heart sounds.  Exam reveals no gallop and no friction rub.   No murmur heard. Pulmonary/Chest: Effort normal and breath  sounds normal. No respiratory distress. He has no wheezes. He has no rales.  Abdominal: Soft. Bowel sounds are normal. He exhibits no mass. There is no tenderness. There is no rebound and no guarding.  Musculoskeletal: Normal range of motion. He exhibits no edema.  Moves all extremities normally. 8cm scar to right forearm.   Lymphadenopathy:    He has no cervical adenopathy.  Neurological: He is alert and oriented to person, place, and time. He displays normal reflexes.  Skin: Skin is warm and dry. No rash  noted.  Psychiatric: He has a normal mood and affect. His behavior is normal.  Nursing note and vitals reviewed.   ED Course  Procedures   DIAGNOSTIC STUDIES: Oxygen Saturation is 97% on RA, adequate by my interpretation.    COORDINATION OF CARE: 12:15 AM Discussed treatment plan with pt at bedside and pt agreed to plan.   Labs Review Labs Reviewed  ACETAMINOPHEN LEVEL - Abnormal; Notable for the following:    Acetaminophen (Tylenol), Serum <10 (*)    All other components within normal limits  COMPREHENSIVE METABOLIC PANEL - Abnormal; Notable for the following:    Sodium 133 (*)    Glucose, Bld 137 (*)    All other components within normal limits  URINE RAPID DRUG SCREEN (HOSP PERFORMED) NOT AT [facility] - Abnormal; Notable for the following:    Opiates POSITIVE (*)    Cocaine POSITIVE (*)    All other components within normal limits  CBC  ETHANOL  SALICYLATE LEVEL    Imaging Review No results found.   EKG Interpretation None      MDM   Final diagnoses:  None   Patient presents emergency department for detox from heroine. He currently states she's having mild withdrawal symptoms such as diffuse body aches and nausea. He is requesting to go to a facility [place]. TTS was consult to help facilitate this. He was given clonidine and Zofran to help with his withdrawal symptoms.  TTS is requesting laboratory workup on this patient. This is ordered for him. Disposition is pending TTS evaluation.  TTS recs for SW consult in the morning to get him placed.  If they can not do this, patient can be DC'ed with outpatient resources.  Patient will be signed out to oncoming provider, please see their note for the ultimate disposition.  I personally performed the services described in this documentation, which was scribed in my presence. The recorded information has been reviewed and is accurate.   Tomasita CrumbleAdeleke Morissa Obeirne, MD 05/13/15 [phone]

## 2015-05-13 LAB — COMPREHENSIVE METABOLIC PANEL
ALT: 21 U/L (ref 17–63)
AST: 24 U/L (ref 15–41)
Albumin: 3.5 g/dL (ref 3.5–5.0)
Alkaline Phosphatase: 98 U/L (ref 38–126)
Anion gap: 9 (ref 5–15)
BUN: 15 mg/dL (ref 6–20)
CALCIUM: 9.1 mg/dL (ref 8.9–10.3)
CO2: 23 mmol/L (ref 22–32)
Chloride: 101 mmol/L (ref 101–111)
Creatinine, Ser: 0.92 mg/dL (ref 0.61–1.24)
GFR calc Af Amer: >60 mL/min (ref >60)
GFR calc non Af Amer: >60 mL/min (ref >60)
Glucose, Bld: 137 mg/dL — ABNORMAL HIGH (ref 65–99)
Potassium: 4.7 mmol/L (ref 3.5–5.1)
Sodium: 133 mmol/L — ABNORMAL LOW (ref 135–145)
Total Bilirubin: 0.4 mg/dL (ref 0.3–1.2)
Total Protein: 7.4 g/dL (ref 6.5–8.1)

## 2015-05-13 LAB — CBC
HCT: 40.7 % (ref 39.0–52.0)
HEMOGLOBIN: 13.7 g/dL (ref 13.0–17.0)
MCH: 30 pg (ref 26.0–34.0)
MCHC: 33.7 g/dL (ref 30.0–36.0)
MCV: 89.3 fL (ref 78.0–100.0)
PLATELETS: 319 10*3/uL (ref 150–400)
RBC: 4.56 MIL/uL (ref 4.22–5.81)
RDW: 13 % (ref 11.5–15.5)
WBC: 6.6 10*3/uL (ref 4.0–10.5)

## 2015-05-13 LAB — ETHANOL: Alcohol, Ethyl (B): <5 mg/dL (ref <5)

## 2015-05-13 LAB — RAPID URINE DRUG SCREEN, HOSP PERFORMED
Amphetamines: NOT DETECTED
Barbiturates: NOT DETECTED
Benzodiazepines: NOT DETECTED
COCAINE: POSITIVE — AB
Opiates: POSITIVE — AB
Tetrahydrocannabinol: NOT DETECTED

## 2015-05-13 LAB — ACETAMINOPHEN LEVEL: Acetaminophen (Tylenol), Serum: <10 ug/mL — ABNORMAL LOW (ref 10–30)

## 2015-05-13 LAB — SALICYLATE LEVEL

## 2015-05-13 MED ORDER — GI COCKTAIL ~~LOC~~
30.0000 mL | Freq: Once | ORAL | Status: AC
  Administered 2015-05-13: 30 mL via ORAL
  Filled 2015-05-13: qty 30

## 2015-05-13 MED ORDER — ONDANSETRON 4 MG PO TBDP
4.0000 mg | ORAL_TABLET | Freq: Once | ORAL | Status: AC
  Administered 2015-05-13: 4 mg via ORAL
  Filled 2015-05-13: qty 1

## 2015-05-13 MED ORDER — KETOROLAC TROMETHAMINE 30 MG/ML IJ SOLN: 30.0000 mg | Freq: Once | INTRAMUSCULAR | Status: DC

## 2015-05-13 MED ORDER — CLONIDINE HCL 0.1 MG PO TABS
0.1000 mg | ORAL_TABLET | Freq: Once | ORAL | Status: AC
  Administered 2015-05-13: 0.1 mg via ORAL
  Filled 2015-05-13: qty 1

## 2015-05-13 MED ORDER — CHLORDIAZEPOXIDE HCL 25 MG PO CAPS
50.0000 mg | ORAL_CAPSULE | Freq: Once | ORAL | Status: AC
  Administered 2015-05-13: 50 mg via ORAL
  Filled 2015-05-13: qty 2

## 2015-05-13 MED ORDER — CHLORDIAZEPOXIDE HCL 25 MG PO CAPS: 25.0000 mg | ORAL_CAPSULE | Freq: Once | ORAL | Status: DC

## 2015-05-13 MED ORDER — KETOROLAC TROMETHAMINE 60 MG/2ML IM SOLN
60.0000 mg | Freq: Once | INTRAMUSCULAR | Status: AC
  Administered 2015-05-13: 60 mg via INTRAMUSCULAR
  Filled 2015-05-13: qty 2

## 2015-05-13 NOTE — ED Notes (Signed)
Pt reading book at bedside. Pt given paper scrubs to change into.

## 2015-05-13 NOTE — ED Notes (Addendum)
Family at bedside report patient was sent here for medical clearance by [facility].

## 2015-05-13 NOTE — Progress Notes (Signed)
CSW was informed during shift report pt has stated "he has a bed at [facility]." Inpatient detox was recommended by Karleen HampshireSpencer, PA.  CSW called [facility] and spoke with Andrey CampanileSandy who reports that information was inaccurate- pt does not have a bed reserved, "but fax over his information and we will look at it and see if we can get him a bed."   CSW faxed referral to [facility].  Ilean SkillMeghan Cranford Blessinger, MSW, LCSWA Clinical Social Work, Disposition  05/13/2015 [phone]

## 2015-05-13 NOTE — ED Notes (Signed)
Social work called [facility] and [facility]

## 2015-05-13 NOTE — ED Notes (Signed)
Pt is very irritable at this time and sts "This is a hospital aren't ya'll supposed to have Cyboxen or something else besides this stuff.  I'd be much better if ya'll would just leave me alone and let me sleep."  RN apologized and explained the effects of the ordered medications.  Family verbalized understanding.  Pt fell back asleep at this time.

## 2015-05-13 NOTE — BHH Counselor (Signed)
TC from [facility] at [facility]. They will accept pt once they know how pt will be transported to [facility] and they will need to informed when pt leaves [facility]. Meghan CSW notified.  Evette Cristalaroline Paige Alani Lacivita, [place] Therapeutic Triage Specialist

## 2015-05-13 NOTE — Progress Notes (Signed)
[facility] report # [phone]. [facility]. Sandy requests RN call report when pt is preparing to leave so that they have most recent vitals and can gauge his arrival time.  Spoke with [facility] SW re: pt's placement. [facility] to call this writer at [phone] if further assistance with this placement is needed.  Ilean SkillMeghan Tavionna Grout, MSW, LCSWA Clinical Social Work, Disposition  05/13/2015 [phone]

## 2015-05-13 NOTE — ED Notes (Signed)
Pt requesting detox from heroin and percocet. Reports last intake of percocet "a while ago;" last intake of heroin was 05/11/15, reports using ~0.5g-1g. Denies SI/HI

## 2015-05-13 NOTE — ED Notes (Signed)
Pt requesting for staff "to help me detox." pt falling asleep during conversation, reports generalized body aches

## 2015-05-13 NOTE — ED Notes (Signed)
[facility]; pt meets inpt criteria for treatment. Sister of pt reports pt has a "bed waiting" at [facility]. Social work to consult with patient in the morning (after 7am) to verify bed availability at [facility] or provide other resources for detox.

## 2015-05-13 NOTE — Progress Notes (Signed)
Patient has a bed at [facility]. RN made aware we can transport patient. Patient will transport by Christopher [name].  He is voluntary. Patient continues to detox, but will complete treatment at [facility].  No other needs at this time.  Deretha EmoryHannah Constantine Ruddick LCSW, MSW Clinical Social Work: Emergency Room [phone]

## 2015-05-13 NOTE — BH Assessment (Addendum)
Tele Assessment Note   Christopher [name] is an 49 y.o. recently divorces male whose sister brought him to the [facility] at his request requesting detox from Opioids.  Pt reports that he drinks alcohol only occasionally (every 6 months) and has not had any alcohol recently.  Pt reported that he engaged in daily use of marijuana in his teens through late 20's/early 30's but then "outgrew it."  Pt states he does not use marijuana anymore except on "rare occasions socially."  Pt stated "it's been 35 years of the same things and my body is giving out." Pt denies SI, HI, SH urges and AVH.  Pt stated that recent stressors have been signing final divorce papers last week after 25 years of marriage, chronic pain from a hip injury that he medicates with "street drugs" because he cannot afford Rx medications without insurance. 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  Pt reports that he currently lives with his father and feels support from his sisters, one of whom (June) was present at the hospital with pt.  June reported that she had been talking to [facility] asking for treatment and that they had arranged placement there if none at [facility]. Pt currently reports symptoms of depression including deep sadness, fatigue, guilt, lower self-esteem, loss of interest in activities, difficulty motivating himself to take necessary actions, anger/irritability, feelings of hopelessness/helplessness, sleep and eating disturbances.  UDS= + Cocaine, Opiates; ETOH >5 after labs completed at [facility].   Pt stated that he has never been through detox before, never been IP for MH or SA issues before and has never had OPT before. 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  He works [employer] representative still periodically, if he can  self-medicate with opioids, when work is available to him he stated. Pt denied a hx of aggression or violence.  Pt stated his philosophy is "live and let live." Pt denied any hx of suicide, MH or SA issues in his family.  Pt denied any physical, sexual or emotional/verbal abuse.   Pt was alert, pleasant and cooperative during the assessment.  He was dressed in gown and lying in his hospital bed during the assessment.  Pt kept good eye contact, had freedom of movement and spoke in a clear, low toned voice. Pt's thought processes were coherent and relevant and his insight was good though impulse control was poor.  Pt's mood was depressed and his blunted affect was congruent. Pt was oriented x 4.   Axis I: 311 Unspecified Depressive Disorder; 304.00 Opioid Use Disorder, Severe;  Axis II: Deferred Axis III:  Past Medical History  Diagnosis Date  . IVDU (intravenous drug user)   . Polysubstance abuse   . Heroin addiction    Axis IV: economic problems, housing problems, occupational problems, other psychosocial or environmental problems, problems related to social environment and problems with primary support group Axis V: 11-20 some danger of hurting self or others possible OR occasionally fails to maintain minimal personal hygiene OR gross impairment in communication  Past Medical History:  Past Medical History  Diagnosis Date  . IVDU (intravenous drug user)   . Polysubstance abuse   . Heroin addiction     Past Surgical History  Procedure Laterality Date  . Incision and  drainage abscess Right 12/10/2014    Proximal forearm dorsal deep abscess.  . Ankle fracture surgery Left 2012  . Fracture surgery    . I&d extremity Right 12/10/2014    Procedure: IRRIGATION AND DEBRIDEMENT EXTREMITY;  Surgeon: Dairl PonderMatthew Weingold, MD;  [facility];  Service: Orthopedics;  Laterality: Right;  . I&d extremity Right 12/12/2014    Procedure: IRRIGATION AND DEBRIDEMENT EXTREMITY;  Surgeon: Dairl PonderMatthew Weingold,  MD;  Location: [facility] OR;  Service: Orthopedics;  Laterality: Right;  forearm    Family History:  Family History  Problem Relation Age of Onset  . Liver disease Mother   . Heart disease Father     Social History:  reports that he has been smoking Cigarettes.  He has a 15 pack-year smoking history. He has never used smokeless tobacco. He reports that he drinks alcohol. He reports that he uses illicit drugs.  Additional Social History:  Alcohol / Drug Use Prescriptions: See PTA List History of alcohol / drug use?: Yes Longest period of sobriety (when/how long): "don't know" Negative Consequences of Use: Financial, Legal, Personal relationships, Work / School Substance #1 Name of Substance 1: Heroin 1 - Age of First Use: 47 1 - Amount (size/oz): 1/4 to 1/2 gram 1 - Frequency: daily 1 - Duration: 2 years 1 - Last Use / Amount: yesterday 05/11/15 Substance #2 Name of Substance 2: Alcohol 2 - Age of First Use: 13 2 - Amount (size/oz): 2 beers on work day to 12 pack on the weekends 2 - Frequency: "regualry" 2 - Duration: "stopped all byt occasional drinking 2 years ago" 2 - Last Use / Amount: 1-6 pk 6 months ago Substance #3 Name of Substance 3: Marijuana 3 - Age of First Use: teens 3 - Amount (size/oz): "bong" smoking - "I don't know" 3 - Frequency: daily 3 - Duration: "years" 3 - Last Use / Amount: "stopped all but occasional use in my late 20's or early 30's"  CIWA: CIWA-Ar BP: 131/67 mmHg Pulse Rate: 93 COWS: Clinical Opiate Withdrawal Scale (COWS) Resting Pulse Rate: Pulse Rate 81-100 Sweating: Flushed or Observable moistness on face Restlessness: Able to sit still Pupil Size: Pupils pinned or normal size for room light Bone or Joint Aches: Mild diffuse discomfort Runny Nose or Tearing: Nasal stuffiness or unusually moist eyes GI Upset: Stomach cramps Tremor: Tremor can be felt, but not observed Yawning: No yawning Anxiety or Irritability: None Gooseflesh Skin: Skin is  smooth COWS Total Score: 7  PATIENT STRENGTHS: (choose at least two) Ability for insight Average or above average intelligence Communication skills Supportive family/friends  Allergies: No Known Allergies  Home Medications:  (Not in a hospital admission)  OB/GYN Status:  No LMP for male patient.  General Assessment Data [facility] Assessment: In system Is this an Initial Assessment or a Re-assessment for this encounter?: Initial Assessment Marital status: Divorced ("signed divorce papers last week" 25 yr marriage) Juanell FairlyMaiden name: na Is patient pregnant?: No Pregnancy Status: No Living Arrangements: Parent (lives with father) Can pt return to current living arrangement?: Yes Admission Status: Voluntary Is patient capable of signing voluntary admission?: Yes Referral Source: Self/Family/Friend Insurance type: Medicaid  Medical Screening Exam [facility] Walk[facility] ONLY) Medical Exam completed: No Reason for MSE not completed: Other: (Labs incomplete)  Crisis Care Plan Living Arrangements: Parent (lives with father) Name of Psychiatrist: na Name of Therapist: na  Education Status Is patient currently in school?: No Current Grade: na Highest grade of school patient has completed:  12 (HS grad) Name of school: na Contact person: na  Risk to self with the past 6 months Suicidal Ideation: No (denies) Has patient been a risk to self within the past 6 months prior to admission? : No (denies) Suicidal Intent: No (denies) Has patient had any suicidal intent within the past 6 months prior to admission? : No Is patient at risk for suicide?: No Suicidal Plan?: No Has patient had any suicidal plan within the past 6 months prior to admission? : No Access to Means: No (denies) What has been your use of drugs/alcohol within the last 12 months?: daily use Previous Attempts/Gestures:  (denies) How many times?: 0 Other Self Harm Risks: none "just drugs" per pt Triggers  for Past Attempts:  (na) Intentional Self Injurious Behavior: None (denies) Family Suicide History: Unknown Recent stressful life event(s): Divorce, Recent negative physical changes (signed papers last week; hip injury requiring surgery) Persecutory voices/beliefs?: No Depression: Yes Depression Symptoms: Insomnia, Isolating, Fatigue, Guilt, Loss of interest in usual pleasures, Feeling worthless/self pity, Feeling angry/irritable Substance abuse history and/or treatment for substance abuse?: Yes Suicide prevention information given to non-admitted patients: Not applicable  Risk to Others within the past 6 months Homicidal Ideation: No (denies) Does patient have any lifetime risk of violence toward others beyond the six months prior to admission? : No (denies) Thoughts of Harm to Others: No (denies) Current Homicidal Intent: No Current Homicidal Plan: No Access to Homicidal Means: No (denies) Identified Victim: na History of harm to others?: No (denies) Assessment of Violence: None Noted Violent Behavior Description: na Does patient have access to weapons?: No (denies) Criminal Charges Pending?: Yes (traffic violation only per pt) Describe Pending Criminal Charges: traffic violation Does patient have a court date: Yes Court Date:  (June, 2016- doesn't know date) Is patient on probation?: No (denies)  Psychosis Hallucinations: None noted Delusions: None noted  Mental Status Report Appearance/Hygiene: In scrubs, Unremarkable Eye Contact: Good Motor Activity: Freedom of movement, Unremarkable Speech: Logical/coherent, Unremarkable Level of Consciousness: Quiet/awake Mood: Depressed, Pleasant, Apprehensive Affect: Blunted, Depressed Anxiety Level: None Thought Processes: Coherent, Relevant Judgement: Partial Orientation: Person, Place, Time, Situation Obsessive Compulsive Thoughts/Behaviors: None  Cognitive Functioning Concentration: Fair Memory: Recent Intact, Remote  Intact IQ: Average Insight: Fair Impulse Control: Poor Appetite: Fair Weight Loss: 10 (2 months) Weight Gain: 0 Sleep: Decreased Total Hours of Sleep: 3 (3-4 hrs for a week then, 8-10 hrs "catch up") Vegetative Symptoms: None  ADLScreening [facility] Assessment Services) Patient's cognitive ability adequate to safely complete daily activities?: Yes Patient able to express need for assistance with ADLs?: Yes Independently performs ADLs?: Yes (appropriate for developmental age)  Prior Inpatient Therapy Prior Inpatient Therapy: No (denies) Prior Therapy Dates: na Prior Therapy Facilty/Provider(s): na Reason for Treatment: na  Prior Outpatient Therapy Prior Outpatient Therapy: No Prior Therapy Dates: na Prior Therapy Facilty/Provider(s): na Reason for Treatment: na Does patient have an ACCT team?: No Does patient have Intensive In-House Services?  : No Does patient have [facility] services? : No Does patient have P4CC services?: No  ADL Screening (condition at time of admission) Patient's cognitive ability adequate to safely complete daily activities?: Yes Patient able to express need for assistance with ADLs?: Yes Independently performs ADLs?: Yes (appropriate for developmental age)       Abuse/Neglect Assessment (Assessment to be complete while patient is alone) Physical Abuse: Denies Verbal Abuse: Denies Sexual Abuse: Denies     Advance Directives (For Healthcare) Does patient have an advance directive?: No Would patient  like information on creating an advanced directive?: No - patient declined information    Additional Information 1:1 In Past 12 Months?: No CIRT Risk: No Elopement Risk: No Does patient have medical clearance?: No     Disposition:  Disposition Initial Assessment Completed for this Encounter: Yes Disposition of Patient: Other dispositions (Pending review w [facility] Extender) Other disposition(s): Other (Comment)  Per Donell Sievert, PA:  Meets IP  criteria.  Based on sister's statement that [facility] "has a bed waiting" have SW call them to coordinate d/c at [facility] and admit there.  If [facility] not have a bed available for pt, seek placement at [facility] or [facility] or other tx facility.  Spoke to Dr. Mora Bellman at [facility]:  Advised of recommendation.  He agreed. Spoke to [place], Engineer, civil (consulting) at [employer]: Advised of plan.  Beryle Flock, MS, CRC, [facility] Triage Specialist [facility] T 05/13/2015 1:42 AM

## 2015-07-27 ENCOUNTER — Encounter (HOSPITAL_COMMUNITY): Payer: Self-pay | Admitting: Emergency Medicine

## 2015-07-27 ENCOUNTER — Emergency Department (HOSPITAL_COMMUNITY)
Admission: EM | Admit: 2015-07-27 | Discharge: 2015-07-27 | Discharge status: Against Medical Advice | Payer: Medicaid Other | Attending: Emergency Medicine | Admitting: Emergency Medicine

## 2015-07-27 DIAGNOSIS — Z720 Tobacco use: Secondary | ICD-10-CM | POA: Insufficient documentation

## 2015-07-27 DIAGNOSIS — T401X1A Poisoning by heroin, accidental (unintentional), initial encounter: Secondary | ICD-10-CM | POA: Insufficient documentation

## 2015-07-27 DIAGNOSIS — Y9389 Activity, other specified: Secondary | ICD-10-CM | POA: Insufficient documentation

## 2015-07-27 DIAGNOSIS — Y9289 Other specified places as the place of occurrence of the external cause: Secondary | ICD-10-CM | POA: Insufficient documentation

## 2015-07-27 DIAGNOSIS — Y998 Other external cause status: Secondary | ICD-10-CM | POA: Insufficient documentation

## 2015-07-27 NOTE — ED Provider Notes (Signed)
49yo M w/ h/o heroin abuse brought by EMS for unresponsiveness. Per report from EMS, patient was found unresponsive. Given 0.4mg  IV narcan after which he became alert and oriented. He had admitted to using heroin today for pain control of hip. I reviewed vital signs in chart as well as nursing note and when I went to his room to evaluate him, he had left without being seen.  Laurence Spates, MD 07/27/15 1150

## 2015-07-27 NOTE — ED Notes (Signed)
Per EMS pt was found unresponsive in car in garage. Pt was given 0.4mg  Narcan via IV at scene.  Pt became A&Ox4.  Pt admits to doing small amount of heroin today.  Pt states that he has hip pain and doesn't have insurance so he cant get  The surgery that he needs to help with the pain, so decided to "self medicate"  Per pt.

## 2015-07-27 NOTE — ED Notes (Signed)
Bed: WA13 Expected date:  Expected time:  Means of arrival:  Comments: EMS-OD 

## 2015-07-27 NOTE — ED Notes (Signed)
Pt went to bathroom and never returned to his room.  ED staff, security, and [employer] Have looked around department, parking lot and surrounding hospital areas.  Pt unable to be found.

## 2017-10-21 ENCOUNTER — Emergency Department (HOSPITAL_COMMUNITY): Payer: Self-pay

## 2017-10-21 ENCOUNTER — Emergency Department (HOSPITAL_COMMUNITY)
Admission: EM | Admit: 2017-10-21 | Discharge: 2017-10-21 | Disposition: A | Discharge status: Home / Self Care | Payer: Self-pay | Attending: Emergency Medicine | Admitting: Emergency Medicine

## 2017-10-21 ENCOUNTER — Encounter (HOSPITAL_COMMUNITY): Payer: Self-pay | Admitting: Emergency Medicine

## 2017-10-21 DIAGNOSIS — F17210 Nicotine dependence, cigarettes, uncomplicated: Secondary | ICD-10-CM | POA: Insufficient documentation

## 2017-10-21 DIAGNOSIS — L237 Allergic contact dermatitis due to plants, except food: Secondary | ICD-10-CM | POA: Insufficient documentation

## 2017-10-21 DIAGNOSIS — L03114 Cellulitis of left upper limb: Secondary | ICD-10-CM | POA: Insufficient documentation

## 2017-10-21 DIAGNOSIS — Z79899 Other long term (current) drug therapy: Secondary | ICD-10-CM | POA: Insufficient documentation

## 2017-10-21 DIAGNOSIS — L02414 Cutaneous abscess of left upper limb: Secondary | ICD-10-CM | POA: Insufficient documentation

## 2017-10-21 LAB — CBC WITH DIFFERENTIAL/PLATELET
Basophils Absolute: 0 10*3/uL (ref 0.0–0.1)
Basophils Relative: 0 %
EOS PCT: 3 %
Eosinophils Absolute: 0.3 10*3/uL (ref 0.0–0.7)
HCT: 42.6 % (ref 39.0–52.0)
Hemoglobin: 14.4 g/dL (ref 13.0–17.0)
LYMPHS PCT: 26 %
Lymphs Abs: 2.4 10*3/uL (ref 0.7–4.0)
MCH: 29.9 pg (ref 26.0–34.0)
MCHC: 33.8 g/dL (ref 30.0–36.0)
MCV: 88.6 fL (ref 78.0–100.0)
MONO ABS: 0.6 10*3/uL (ref 0.1–1.0)
Monocytes Relative: 6 %
Neutro Abs: 6 10*3/uL (ref 1.7–7.7)
Neutrophils Relative %: 65 %
PLATELETS: 337 10*3/uL (ref 150–400)
RBC: 4.81 MIL/uL (ref 4.22–5.81)
RDW: 13.7 % (ref 11.5–15.5)
WBC: 9.3 10*3/uL (ref 4.0–10.5)

## 2017-10-21 LAB — COMPREHENSIVE METABOLIC PANEL
ALK PHOS: 116 U/L (ref 38–126)
ALT: 29 U/L (ref 17–63)
ANION GAP: 7 (ref 5–15)
AST: 28 U/L (ref 15–41)
Albumin: 3.7 g/dL (ref 3.5–5.0)
BILIRUBIN TOTAL: 0.7 mg/dL (ref 0.3–1.2)
BUN: 6 mg/dL (ref 6–20)
CALCIUM: 9.3 mg/dL (ref 8.9–10.3)
CO2: 24 mmol/L (ref 22–32)
Chloride: 104 mmol/L (ref 101–111)
Creatinine, Ser: 0.89 mg/dL (ref 0.61–1.24)
GFR calc Af Amer: >60 mL/min (ref >60)
Glucose, Bld: 91 mg/dL (ref 65–99)
POTASSIUM: 4.2 mmol/L (ref 3.5–5.1)
Sodium: 135 mmol/L (ref 135–145)
TOTAL PROTEIN: 7.8 g/dL (ref 6.5–8.1)

## 2017-10-21 LAB — I-STAT CG4 LACTIC ACID, ED: Lactic Acid, Venous: 1.36 mmol/L (ref 0.5–1.9)

## 2017-10-21 MED ORDER — CEPHALEXIN 250 MG PO CAPS
500.0000 mg | ORAL_CAPSULE | Freq: Once | ORAL | Status: AC
  Administered 2017-10-21: 500 mg via ORAL
  Filled 2017-10-21: qty 2

## 2017-10-21 MED ORDER — IOPAMIDOL (ISOVUE-300) INJECTION 61%
INTRAVENOUS | Status: AC
  Administered 2017-10-21: 75 mL
  Filled 2017-10-21: qty 75

## 2017-10-21 MED ORDER — LIDOCAINE-EPINEPHRINE (PF) 2 %-1:200000 IJ SOLN
10.0000 mL | Freq: Once | INTRAMUSCULAR | Status: AC
  Administered 2017-10-21: 10 mL via INTRADERMAL
  Filled 2017-10-21: qty 20

## 2017-10-21 MED ORDER — SULFAMETHOXAZOLE-TRIMETHOPRIM 800-160 MG PO TABS: 1.0000 | ORAL_TABLET | Freq: Two times a day (BID) | ORAL | 0 refills | Status: AC

## 2017-10-21 MED ORDER — CEPHALEXIN 500 MG PO CAPS: 500.0000 mg | ORAL_CAPSULE | Freq: Three times a day (TID) | ORAL | 0 refills | Status: DC

## 2017-10-21 MED ORDER — DIPHENHYDRAMINE HCL 25 MG PO CAPS
ORAL_CAPSULE | ORAL | Status: AC
  Filled 2017-10-21: qty 1

## 2017-10-21 MED ORDER — DIPHENHYDRAMINE HCL 25 MG PO CAPS
50.0000 mg | ORAL_CAPSULE | Freq: Once | ORAL | Status: AC
  Administered 2017-10-21: 50 mg via ORAL
  Filled 2017-10-21: qty 2

## 2017-10-21 MED ORDER — SULFAMETHOXAZOLE-TRIMETHOPRIM 800-160 MG PO TABS
1.0000 | ORAL_TABLET | Freq: Once | ORAL | Status: AC
  Administered 2017-10-21: 1 via ORAL
  Filled 2017-10-21: qty 1

## 2017-10-21 MED ORDER — DIPHENHYDRAMINE HCL 25 MG PO CAPS
25.0000 mg | ORAL_CAPSULE | Freq: Once | ORAL | Status: AC
  Administered 2017-10-21: 25 mg via ORAL

## 2017-10-21 MED ORDER — ACETAMINOPHEN 500 MG PO TABS
1000.0000 mg | ORAL_TABLET | Freq: Once | ORAL | Status: AC
  Administered 2017-10-21: 1000 mg via ORAL
  Filled 2017-10-21: qty 2

## 2017-10-21 NOTE — ED Triage Notes (Addendum)
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 Swelling noted to left forearm. States it has been swollen for 1 week. States "they normally give me a shot and pills every time it comes back." Pt also has poison ivy to right arm since Tuesday after cutting a tree.  Pt states he is a recovering addict since September for IV drug use.  Pt requesting something for itching.

## 2017-10-21 NOTE — ED Notes (Signed)
CT aware of patient's new IV.  In line to come get patient for scan

## 2017-10-21 NOTE — ED Provider Notes (Signed)
[facility]   CSN: [id number] Arrival date & time: 10/21/17  1254     History   Chief Complaint Chief Complaint  Patient presents with  . Poison Ivy  . MRSA    HPI Christopher [name] is a 51 y.o. male.  Complains of painful left forearm with swelling at left forearm onset 1 week ago.  No injury.  No fever.  No nausea or vomiting.  Feels like MRSA infection he has had in the past.  Also complains of diffuse pruritic rash since exposed to poison ivy on the job 2 days ago.  He denies any shortness of breath.  No treatment prior to coming here.  Nothing makes symptoms better or worse.  HPI  Past Medical History:  Diagnosis Date  . Heroin addiction (HCC)   . IVDU (intravenous drug user)   . Polysubstance abuse [facility](HCC)   Last used heroin middle of September 2018.  Patient Active Problem List   Diagnosis Date Noted  . 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 Abscess of arm, right 12/10/2014  . Cellulitis of right arm 12/10/2014  . Hyperglycemia 12/10/2014  . IV drug user 12/10/2014  . Hyponatremia 12/10/2014    Past Surgical History:  Procedure Laterality Date  . ANKLE FRACTURE SURGERY Left 2012  . FRACTURE SURGERY    . INCISION AND DRAINAGE ABSCESS Right 12/10/2014   Proximal forearm dorsal deep abscess.       Home Medications    Prior to Admission medications   Medication Sig Start Date End Date Taking? Authorizing Provider  gabapentin (NEURONTIN) 300 MG capsule Take 300-900 mg by mouth at bedtime.  04/13/15   [provider]  oxyCODONE-acetaminophen (PERCOCET) 7.5-325 MG per tablet Take 1 tablet by mouth 3 (three) times daily. 04/19/15   [provider]  OXYCONTIN 10 MG T12A 12 hr tablet Take 10 mg by mouth at bedtime. 04/22/15   [provider]    Family History Family History  Problem Relation Age of Onset  . Liver disease Mother   . Heart disease Father     Social History Social History    Tobacco Use  . Smoking status: Current Some Day Smoker    Packs/day: 0.50    Years: 30.00    Pack years: 15.00    Types: Cigarettes  . Smokeless tobacco: Never Used  Substance Use Topics  . Alcohol use: Yes    Comment: 12/10/2014 "used to drink considerable; hardly drink at all anymore"  . Drug use: Yes    Comment: "IV molly or meth"  No alcohol times 2 years.   Allergies   Patient has no known allergies.   Review of Systems Review of Systems  Constitutional: Negative.   HENT: Negative.   Respiratory: Negative.   Cardiovascular: Negative.   Gastrointestinal: Negative.   Musculoskeletal: Negative.        Left forearm pain  Skin: Positive for rash.  Neurological: Negative.   Psychiatric/Behavioral: Negative.   All other systems reviewed and are negative.    Physical Exam Updated Vital Signs BP (!) 130/99   Pulse 70   Temp 98.7 F (37.1 C) (Oral)   Resp 18   SpO2 100%   Physical Exam  Constitutional: He is oriented to person, place, and time. He appears well-developed and well-nourished. No distress.  HENT:  Head: Normocephalic and atraumatic.  Eyes: Conjunctivae are normal. Pupils are equal, round, and reactive to light.  Neck: Neck supple. No tracheal deviation present.  No thyromegaly present.  Cardiovascular: Normal rate, regular rhythm and normal heart sounds.  No murmur heard. Pulmonary/Chest: Effort normal and breath sounds normal.  Abdominal: Soft. Bowel sounds are normal. He exhibits no distension. There is no tenderness.  Musculoskeletal: Normal range of motion. He exhibits no edema or tenderness.  Left forearm there is a golf ball sized swollen area at radial aspect of forearm, which is minimally tender.  No fluctuance.  Not red or hot.  Volar aspect of forearm looks mildly swollen and red, not hot or tender.  No red streaks proximally.  Radial pulse 2+.  Good capillary refill all other extremities without tenderness deformity or swelling,  neurovascular intact.  Neurological: He is alert and oriented to person, place, and time. Coordination normal.  Skin: Skin is warm and dry. Capillary refill takes less than 2 seconds. Rash noted.  Pruritic rash at bilateral thighs and belt line.  Not involving genitalia or perineum.  Slight rash which is pruritic on bilateral arms.  Psychiatric: He has a normal mood and affect.  Nursing note and vitals reviewed.    ED Treatments / Results  Labs (all labs ordered are listed, but only abnormal results are displayed) Labs Reviewed  COMPREHENSIVE METABOLIC PANEL  CBC WITH DIFFERENTIAL/PLATELET  I-STAT CG4 LACTIC ACID, ED    EKG  EKG Interpretation None       Radiology No results found.  Procedures .Marland KitchenIncision and Drainage Date/Time: 10/21/2017 8:42 PM Performed by: Doug Sou, MD Authorized by: Doug Sou, MD   Consent:    Consent obtained:  Verbal   Consent given by:  Patient   Risks discussed:  Bleeding, incomplete drainage and pain   Alternatives discussed:  Observation Location:    Type:  Abscess   Location:  Upper extremity   Upper extremity location:  Arm   Arm location:  L lower arm Pre-procedure details:    Skin preparation:  Betadine Anesthesia (see MAR for exact dosages):    Anesthesia method:  Local infiltration   Local anesthetic:  Lidocaine 2% WITH epi Procedure type:    Complexity:  Complex Procedure details:    Incision types:  Single straight   Incision depth:  Muscle   Scalpel blade:  10   Wound management:  Probed and deloculated, irrigated with saline and extensive cleaning   Drainage amount:  Copious   Wound treatment:  Wound left open   Packing materials:  None Post-procedure details:    Patient tolerance of procedure:  Tolerated well, no immediate complications   (including critical care time)  Medications Ordered in ED Medications  diphenhydrAMINE (BENADRYL) 25 mg capsule (not administered)  diphenhydrAMINE (BENADRYL)  capsule 50 mg (not administered)  diphenhydrAMINE (BENADRYL) capsule 25 mg (25 mg Oral Given 10/21/17 1330)    Results for orders placed or performed during the hospital encounter of 10/21/17  Comprehensive metabolic panel  Result Value Ref Range   Sodium 135 135 - 145 mmol/L   Potassium 4.2 3.5 - 5.1 mmol/L   Chloride 104 101 - 111 mmol/L   CO2 24 22 - 32 mmol/L   Glucose, Bld 91 65 - 99 mg/dL   BUN 6 6 - 20 mg/dL   Creatinine, Ser 9.81 0.61 - 1.24 mg/dL   Calcium 9.3 8.9 - 19.1 mg/dL   Total Protein 7.8 6.5 - 8.1 g/dL   Albumin 3.7 3.5 - 5.0 g/dL   AST 28 15 - 41 U/L   ALT 29 17 - 63 U/L   Alkaline Phosphatase 116  38 - 126 U/L   Total Bilirubin 0.7 0.3 - 1.2 mg/dL   GFR calc non Af Amer >60 >60 mL/min   GFR calc Af Amer >60 >60 mL/min   Anion gap 7 5 - 15  CBC with Differential  Result Value Ref Range   WBC 9.3 4.0 - 10.5 K/uL   RBC 4.81 4.22 - 5.81 MIL/uL   Hemoglobin 14.4 13.0 - 17.0 g/dL   HCT 16.1 09.6 - 04.5 %   MCV 88.6 78.0 - 100.0 fL   MCH 29.9 26.0 - 34.0 pg   MCHC 33.8 30.0 - 36.0 g/dL   RDW 40.9 81.1 - 91.4 %   Platelets 337 150 - 400 K/uL   Neutrophils Relative % 65 %   Neutro Abs 6.0 1.7 - 7.7 K/uL   Lymphocytes Relative 26 %   Lymphs Abs 2.4 0.7 - 4.0 K/uL   Monocytes Relative 6 %   Monocytes Absolute 0.6 0.1 - 1.0 K/uL   Eosinophils Relative 3 %   Eosinophils Absolute 0.3 0.0 - 0.7 K/uL   Basophils Relative 0 %   Basophils Absolute 0.0 0.0 - 0.1 K/uL  I-Stat CG4 Lactic Acid, ED  Result Value Ref Range   Lactic Acid, Venous 1.36 0.5 - 1.9 mmol/L   Ct Forearm Left W Contrast  Result Date: 10/21/2017 CLINICAL DATA:  infection to the left arm with swelling EXAM: CT OF THE UPPER LEFT EXTREMITY WITH CONTRAST TECHNIQUE: Multidetector CT imaging of the upper left extremity was performed according to the standard protocol following intravenous contrast administration. COMPARISON:  None. CONTRAST:  75mL ISOVUE-300 IOPAMIDOL (ISOVUE-300) INJECTION  61%<Contrast>32mL ISOVUE-300 IOPAMIDOL (ISOVUE-300) INJECTION 61% FINDINGS: Bones/Joint/Cartilage No fracture or malalignment. Nonspecific small focus of sclerosis in the distal epiphysis of the ulna, likely a bone island. No periostitis. No bone destruction is evident. No large elbow effusion. Ligaments Suboptimally assessed by CT. Muscles and Tendons Normal muscle bulk. Visible dorsal and ventral tendons grossly normal in position and without large focal fluid collection. Soft tissues Moderate subcutaneous edema throughout the forearm. Thick rim enhancing fluid collection within the subcutaneous soft tissues of the mid to distal forearm measuring 1.5 cm transverse by 5.4 cm longitudinal by 2.8 cm AP. Fluid collection is visualized on the radial side of the mid to distal forearm. IMPRESSION: 1. Moderate subcutaneous edema throughout the forearm consistent with cellulitis. 5.4 cm longitudinal by a 1.5 cm transverse thick rim enhancing fluid collection within the subcutaneous soft tissues of the mid to distal forearm on the radial side, consistent with a soft tissue abscess. 2. No fracture or malalignment. No definite periostitis or bone destruction. Electronically Signed   By: Jasmine Pang M.D.   On: 10/21/2017 20:12   Initial Impression / Assessment and Plan / ED Course  I have reviewed the triage vital signs and the nursing notes.  Pertinent labs & imaging results that were available during my care of the patient were reviewed by me and considered in my medical decision making (see chart for details).     CT scan of forearm ordered to check for abscess or deep space infection Patient is current on tetanus immunization.  835 PM itching is improved to treatment with Benadryl.  Plan Tylenol for pain.  Prescriptions Keflex, Bactrim.  Wound check 2 days.  Benadryl for itch.  We will not place patient on prednisone for poison ivy as steroid could possibly interfere with wound healing.  Referral primary  care Final Clinical Impressions(s) / ED Diagnoses  Diagnosis #  1 cutaneous abscess of left forearm #2 cellulitis of left forearm #3 contact dermatitis from poison ivy Final diagnoses:  None    New Prescriptions This SmartLink is deprecated. Use AVSMEDLIST instead to display the medication list for a patient.   Doug Sou, MD 10/21/17 2045

## 2017-10-21 NOTE — Discharge Instructions (Signed)
Hold the wound on your arm under the shower 4 times daily for 30 minutes of the time.  Take Tylenol every 4 hours as needed for pain.  Take the antibiotic as prescribed.  Get your wound rechecked in an urgent care center in 2 days.  Return to the emergency department if you develop fever, vomiting, or if concern for any reason.  You can take Benadryl 50 mg 4 times daily as needed for itch from poison ivy.  Call the number on these discharge instructions tomorrow to get a primary care physician.

## 2019-02-10 ENCOUNTER — Encounter (HOSPITAL_COMMUNITY): Payer: Self-pay

## 2019-02-10 ENCOUNTER — Emergency Department (HOSPITAL_COMMUNITY)
Admission: EM | Admit: 2019-02-10 | Discharge: 2019-02-10 | Disposition: A | Discharge status: Home / Self Care | Payer: Self-pay | Attending: Emergency Medicine | Admitting: Emergency Medicine

## 2019-02-10 DIAGNOSIS — Z7721 Contact with and (suspected) exposure to potentially hazardous body fluids: Secondary | ICD-10-CM | POA: Insufficient documentation

## 2019-02-10 DIAGNOSIS — F17210 Nicotine dependence, cigarettes, uncomplicated: Secondary | ICD-10-CM | POA: Insufficient documentation

## 2019-02-10 DIAGNOSIS — Z0189 Encounter for other specified special examinations: Secondary | ICD-10-CM

## 2019-02-10 DIAGNOSIS — Z79899 Other long term (current) drug therapy: Secondary | ICD-10-CM | POA: Insufficient documentation

## 2019-02-10 LAB — COMPREHENSIVE METABOLIC PANEL
ALBUMIN: 3.6 g/dL (ref 3.5–5.0)
ALK PHOS: 117 U/L (ref 38–126)
ALT: 35 U/L (ref 0–44)
ANION GAP: 11 (ref 5–15)
AST: 41 U/L (ref 15–41)
BILIRUBIN TOTAL: 0.6 mg/dL (ref 0.3–1.2)
BUN: 14 mg/dL (ref 6–20)
CALCIUM: 9.1 mg/dL (ref 8.9–10.3)
CO2: 22 mmol/L (ref 22–32)
Chloride: 103 mmol/L (ref 98–111)
Creatinine, Ser: 0.81 mg/dL (ref 0.61–1.24)
GFR calc Af Amer: >60 mL/min (ref >60)
GLUCOSE: 84 mg/dL (ref 70–99)
Potassium: 4.4 mmol/L (ref 3.5–5.1)
Sodium: 136 mmol/L (ref 135–145)
TOTAL PROTEIN: 7.7 g/dL (ref 6.5–8.1)

## 2019-02-10 LAB — CBC WITH DIFFERENTIAL/PLATELET
Abs Immature Granulocytes: 0.09 10*3/uL — ABNORMAL HIGH (ref 0.00–0.07)
BASOS PCT: 1 %
Basophils Absolute: 0.1 10*3/uL (ref 0.0–0.1)
EOS ABS: 0.1 10*3/uL (ref 0.0–0.5)
Eosinophils Relative: 1 %
HCT: 42.9 % (ref 39.0–52.0)
Hemoglobin: 13.3 g/dL (ref 13.0–17.0)
IMMATURE GRANULOCYTES: 1 %
Lymphocytes Relative: 7 %
Lymphs Abs: 1.1 10*3/uL (ref 0.7–4.0)
MCH: 27.8 pg (ref 26.0–34.0)
MCHC: 31 g/dL (ref 30.0–36.0)
MCV: 89.6 fL (ref 80.0–100.0)
MONO ABS: 0.6 10*3/uL (ref 0.1–1.0)
MONOS PCT: 4 %
NEUTROS PCT: 86 %
Neutro Abs: 13.1 10*3/uL — ABNORMAL HIGH (ref 1.7–7.7)
PLATELETS: 264 10*3/uL (ref 150–400)
RBC: 4.79 MIL/uL (ref 4.22–5.81)
RDW: 13.8 % (ref 11.5–15.5)
WBC: 15.1 10*3/uL — ABNORMAL HIGH (ref 4.0–10.5)
nRBC: 0 % (ref 0.0–0.2)

## 2019-02-10 LAB — RAPID HIV SCREEN (HIV 1/2 AB+AG)
HIV 1/2 Antibodies: NONREACTIVE
HIV-1 P24 ANTIGEN - HIV24: NONREACTIVE

## 2019-02-10 NOTE — ED Notes (Signed)
Pt verbalizes understanding of d/c instructions. Pt ambulatory at d/c with all belongings.   

## 2019-02-10 NOTE — ED Triage Notes (Signed)
Pt presents with [employer] for evaluation of labs. Pt reports he has hepatitis C and [employer] officer was stuck with needle. Pt would like to be tested to confirm. No current treatment.

## 2019-02-10 NOTE — Discharge Instructions (Addendum)
Follow-up with your primary doctor if you experience any problems.  We will call you if any of your tests return abnormal and require further action or treatment.

## 2019-02-10 NOTE — ED Provider Notes (Signed)
[facility] EMERGENCY DEPARTMENT Provider Note   CSN: [id number] Arrival date & time: 02/10/19  1250    History   Chief Complaint Chief Complaint  Patient presents with  . Body Fluid Exposure    HPI Christopher [name] is a 53 y.o. male.     Patient is a 53 year old male with history of IV drug abuse.  He is brought here by authorities for evaluation after being detained by Patent examiner.  While a search of his vehicle was being performed, the arresting officer was stuck in the finger by a needle the patient had used to inject heroin.  This patient states that he has been diagnosed in the past with hepatitis C, however is not currently undergoing any treatment.  He is here at the request of law enforcement to have blood drawn.  Patient has no complaints otherwise.  The history is provided by the patient.    Past Medical History:  Diagnosis Date  . Heroin addiction (HCC)   . IVDU (intravenous drug user)   . Polysubstance abuse [facility])     Patient Active Problem List   Diagnosis Date Noted  . 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 Abscess of arm, right 12/10/2014  . Cellulitis of right arm 12/10/2014  . Hyperglycemia 12/10/2014  . IV drug user 12/10/2014  . Hyponatremia 12/10/2014    Past Surgical History:  Procedure Laterality Date  . ANKLE FRACTURE SURGERY Left 2012  . FRACTURE SURGERY    . I&D EXTREMITY Right 12/10/2014   Procedure: IRRIGATION AND DEBRIDEMENT EXTREMITY;  Surgeon: Dairl Ponder, MD;  [facility] OR;  Service: Orthopedics;  Laterality: Right;  . I&D EXTREMITY Right 12/12/2014   Procedure: IRRIGATION AND DEBRIDEMENT EXTREMITY;  Surgeon: Dairl Ponder, MD;  Location: [facility] OR;  Service: Orthopedics;  Laterality: Right;  forearm  . INCISION AND DRAINAGE ABSCESS Right 12/10/2014   Proximal forearm dorsal deep abscess.        Home Medications    Prior to Admission medications   Medication Sig Start Date End Date Taking? Authorizing  Provider  cephALEXin (KEFLEX) 500 MG capsule Take 1 capsule (500 mg total) 3 (three) times daily by mouth. 10/21/17   Doug Sou, MD  gabapentin (NEURONTIN) 300 MG capsule Take 300-900 mg by mouth at bedtime.  04/13/15   [provider]  oxyCODONE-acetaminophen (PERCOCET) 7.5-325 MG per tablet Take 1 tablet by mouth 3 (three) times daily. 04/19/15   [provider]  OXYCONTIN 10 MG T12A 12 hr tablet Take 10 mg by mouth at bedtime. 04/22/15   [provider]    Family History Family History  Problem Relation Age of Onset  . Liver disease Mother   . Heart disease Father     Social History Social History   Tobacco Use  . Smoking status: Current Some Day Smoker    Packs/day: 0.50    Years: 30.00    Pack years: 15.00    Types: Cigarettes  . Smokeless tobacco: Never Used  Substance Use Topics  . Alcohol use: Yes    Comment: 12/10/2014 "used to drink considerable; hardly drink at all anymore"  . Drug use: Yes    Comment: "IV molly or meth"     Allergies   Patient has no known allergies.   Review of Systems Review of Systems  All other systems reviewed and are negative.    Physical Exam Updated Vital Signs BP (!) 141/87 (BP Location: Left Arm)   Pulse 72   Temp 97.7 F (  36.5 C) (Oral)   Resp 18   SpO2 99%   Physical Exam Vitals signs and nursing note reviewed.  Constitutional:      Appearance: Normal appearance.  HENT:     Head: Normocephalic and atraumatic.  Pulmonary:     Effort: Pulmonary effort is normal.  Skin:    General: Skin is warm and dry.  Neurological:     Mental Status: He is alert and oriented to person, place, and time.      ED Treatments / Results  Labs (all labs ordered are listed, but only abnormal results are displayed) Labs Reviewed  RAPID HIV SCREEN (HIV 1/2 AB+AG)  HEPATITIS PANEL, ACUTE  HEPATITIS C ANTIBODY (REFLEX)  HIV ANTIBODY (ROUTINE TESTING W REFLEX)  HIV-1 RNA QUANT-NO REFLEX-BLD    COMPREHENSIVE METABOLIC PANEL  CBC WITH DIFFERENTIAL/PLATELET    EKG None  Radiology No results found.  Procedures Procedures (including critical care time)  Medications Ordered in ED Medications - No data to display   Initial Impression / Assessment and Plan / ED Course  I have reviewed the triage vital signs and the nursing notes.  Pertinent labs & imaging results that were available during my care of the patient were reviewed by me and considered in my medical decision making (see chart for details).  Patient brought by law enforcement for evaluation after a police officer received a needlestick from a syringe and needle that this patient uses to inject IV drugs.  Patient's rapid HIV test is negative and CBC and CMP are unremarkable.  Patient's hepatitis studies are pending.  At this point, patient appears appropriate for discharge with PRN return.  Final Clinical Impressions(s) / ED Diagnoses   Final diagnoses:  None    ED Discharge Orders    None       Geoffery Lyons, MD 02/10/19 1525

## 2019-02-11 LAB — HEPATITIS PANEL, ACUTE
HCV Ab: >11 s/co ratio — ABNORMAL HIGH (ref 0.0–0.9)
Hep A IgM: NEGATIVE
Hep B C IgM: NEGATIVE
Hepatitis B Surface Ag: NEGATIVE

## 2019-02-11 LAB — HEPATITIS C ANTIBODY (REFLEX)

## 2019-02-11 LAB — COMMENT2 - HEP PANEL

## 2019-02-12 LAB — HCV RNA QUANT: HCV Quantitative: NOT DETECTED IU/mL (ref >50)

## 2019-06-16 ENCOUNTER — Emergency Department (HOSPITAL_COMMUNITY): Payer: Medicaid Other

## 2019-06-16 ENCOUNTER — Emergency Department (HOSPITAL_COMMUNITY)
Admission: EM | Admit: 2019-06-16 | Discharge: 2019-06-16 | Disposition: A | Discharge status: Home / Self Care | Payer: Medicaid Other | Attending: Emergency Medicine | Admitting: Emergency Medicine

## 2019-06-16 ENCOUNTER — Encounter (HOSPITAL_COMMUNITY): Payer: Self-pay

## 2019-06-16 ENCOUNTER — Other Ambulatory Visit: Payer: Self-pay

## 2019-06-16 DIAGNOSIS — M25559 Pain in unspecified hip: Secondary | ICD-10-CM

## 2019-06-16 DIAGNOSIS — M1611 Unilateral primary osteoarthritis, right hip: Secondary | ICD-10-CM

## 2019-06-16 DIAGNOSIS — F17210 Nicotine dependence, cigarettes, uncomplicated: Secondary | ICD-10-CM | POA: Insufficient documentation

## 2019-06-16 DIAGNOSIS — F1110 Opioid abuse, uncomplicated: Secondary | ICD-10-CM | POA: Insufficient documentation

## 2019-06-16 DIAGNOSIS — M545 Low back pain: Secondary | ICD-10-CM | POA: Insufficient documentation

## 2019-06-16 DIAGNOSIS — M25451 Effusion, right hip: Secondary | ICD-10-CM | POA: Insufficient documentation

## 2019-06-16 DIAGNOSIS — G8929 Other chronic pain: Secondary | ICD-10-CM | POA: Insufficient documentation

## 2019-06-16 DIAGNOSIS — M13151 Monoarthritis, not elsewhere classified, right hip: Secondary | ICD-10-CM | POA: Insufficient documentation

## 2019-06-16 LAB — URINALYSIS, ROUTINE W REFLEX MICROSCOPIC
Bilirubin Urine: NEGATIVE
Glucose, UA: NEGATIVE mg/dL
Ketones, ur: NEGATIVE mg/dL
Leukocytes,Ua: NEGATIVE
Nitrite: NEGATIVE
Protein, ur: NEGATIVE mg/dL
Specific Gravity, Urine: 1.015 (ref 1.005–1.030)
pH: 5 (ref 5.0–8.0)

## 2019-06-16 LAB — CBC WITH DIFFERENTIAL/PLATELET
Abs Immature Granulocytes: 0.06 10*3/uL (ref 0.00–0.07)
Basophils Absolute: 0.1 10*3/uL (ref 0.0–0.1)
Basophils Relative: 1 %
Eosinophils Absolute: 0.1 10*3/uL (ref 0.0–0.5)
Eosinophils Relative: 1 %
HCT: 37.3 % — ABNORMAL LOW (ref 39.0–52.0)
Hemoglobin: 12.3 g/dL — ABNORMAL LOW (ref 13.0–17.0)
Immature Granulocytes: 1 %
Lymphocytes Relative: 16 %
Lymphs Abs: 2 10*3/uL (ref 0.7–4.0)
MCH: 28.9 pg (ref 26.0–34.0)
MCHC: 33 g/dL (ref 30.0–36.0)
MCV: 87.6 fL (ref 80.0–100.0)
Monocytes Absolute: 0.8 10*3/uL (ref 0.1–1.0)
Monocytes Relative: 6 %
Neutro Abs: 9.6 10*3/uL — ABNORMAL HIGH (ref 1.7–7.7)
Neutrophils Relative %: 75 %
Platelets: 510 10*3/uL — ABNORMAL HIGH (ref 150–400)
RBC: 4.26 MIL/uL (ref 4.22–5.81)
RDW: 13.2 % (ref 11.5–15.5)
WBC: 12.6 10*3/uL — ABNORMAL HIGH (ref 4.0–10.5)
nRBC: 0 % (ref 0.0–0.2)

## 2019-06-16 LAB — RAPID URINE DRUG SCREEN, HOSP PERFORMED
Amphetamines: NOT DETECTED
Barbiturates: NOT DETECTED
Benzodiazepines: NOT DETECTED
Cocaine: NOT DETECTED
Opiates: NOT DETECTED
Tetrahydrocannabinol: NOT DETECTED

## 2019-06-16 LAB — BASIC METABOLIC PANEL
Anion gap: 11 (ref 5–15)
BUN: 11 mg/dL (ref 6–20)
CO2: 23 mmol/L (ref 22–32)
Calcium: 9 mg/dL (ref 8.9–10.3)
Chloride: 100 mmol/L (ref 98–111)
Creatinine, Ser: 0.62 mg/dL (ref 0.61–1.24)
GFR calc Af Amer: >60 mL/min (ref >60)
GFR calc non Af Amer: >60 mL/min (ref >60)
Glucose, Bld: 100 mg/dL — ABNORMAL HIGH (ref 70–99)
Potassium: 3.9 mmol/L (ref 3.5–5.1)
Sodium: 134 mmol/L — ABNORMAL LOW (ref 135–145)

## 2019-06-16 LAB — GRAM STAIN

## 2019-06-16 LAB — SEDIMENTATION RATE: Sed Rate: 79 mm/hr — ABNORMAL HIGH (ref 0–16)

## 2019-06-16 LAB — C-REACTIVE PROTEIN: CRP: 8.5 mg/dL — ABNORMAL HIGH (ref <1.0)

## 2019-06-16 MED ORDER — GADOBUTROL 1 MMOL/ML IV SOLN
7.0000 mL | Freq: Once | INTRAVENOUS | Status: AC | PRN
  Administered 2019-06-16: 7 mL via INTRAVENOUS

## 2019-06-16 MED ORDER — LIDOCAINE HCL (PF) 1 % IJ SOLN
INTRAMUSCULAR | Status: AC
  Administered 2019-06-16: 5 mL via INTRAMUSCULAR
  Filled 2019-06-16: qty 5

## 2019-06-16 MED ORDER — HYDROMORPHONE HCL 1 MG/ML IJ SOLN
1.0000 mg | Freq: Once | INTRAMUSCULAR | Status: AC
  Administered 2019-06-16: 1 mg via INTRAVENOUS
  Filled 2019-06-16: qty 1

## 2019-06-16 MED ORDER — KETOROLAC TROMETHAMINE 30 MG/ML IJ SOLN
30.0000 mg | Freq: Once | INTRAMUSCULAR | Status: AC
  Administered 2019-06-16: 30 mg via INTRAMUSCULAR
  Filled 2019-06-16: qty 1

## 2019-06-16 MED ORDER — ONDANSETRON HCL 4 MG/2ML IJ SOLN
4.0000 mg | Freq: Once | INTRAMUSCULAR | Status: AC
  Administered 2019-06-16: 4 mg via INTRAVENOUS
  Filled 2019-06-16: qty 2

## 2019-06-16 MED ORDER — PREDNISONE 50 MG PO TABS
60.0000 mg | ORAL_TABLET | Freq: Once | ORAL | Status: AC
  Administered 2019-06-16: 60 mg via ORAL
  Filled 2019-06-16: qty 1

## 2019-06-16 MED ORDER — PREDNISONE 20 MG PO TABS: 20.0000 mg | ORAL_TABLET | Freq: Two times a day (BID) | ORAL | 0 refills | Status: DC

## 2019-06-16 MED ORDER — POVIDONE-IODINE 10 % EX SOLN
CUTANEOUS | Status: AC
  Administered 2019-06-16: 18:00:00
  Filled 2019-06-16: qty 15

## 2019-06-16 MED ORDER — MORPHINE SULFATE (PF) 4 MG/ML IV SOLN
4.0000 mg | Freq: Once | INTRAVENOUS | Status: AC
  Administered 2019-06-16: 4 mg via INTRAVENOUS
  Filled 2019-06-16: qty 1

## 2019-06-16 MED ORDER — OXYCODONE-ACETAMINOPHEN 5-325 MG PO TABS
1.0000 | ORAL_TABLET | Freq: Once | ORAL | Status: AC
  Administered 2019-06-16: 1 via ORAL
  Filled 2019-06-16: qty 1

## 2019-06-16 MED ORDER — OXYCODONE-ACETAMINOPHEN 5-325 MG PO TABS: 1.0000 | ORAL_TABLET | ORAL | 0 refills | Status: DC | PRN

## 2019-06-16 MED ORDER — SODIUM CHLORIDE FLUSH 0.9 % IV SOLN
INTRAVENOUS | Status: AC
  Administered 2019-06-16: 18:00:00
  Filled 2019-06-16: qty 20

## 2019-06-16 NOTE — Discharge Instructions (Signed)
Use heat on the sore area of your hip.  Call the orthopedic doctor for follow-up in his office to be seen for further treatment and evaluation of your hip pain.

## 2019-06-16 NOTE — ED Provider Notes (Signed)
  Face-to-face evaluation   History: He presents for evaluation of right hip pain, with known chronic hip disease with prior suggestion for hip replacement.  Pain is worsening, despite his using heroin to treat the pain.  Physical exam: This time he is alert and fairly comfortable.  He relates having pain for 7 years and in the last week being unable to walk because of the pain.  He has been hobbling around.  He states he is trying to get Medicaid to be able to get his hip fixed.  He is lucid and not in respiratory distress.      Medical decision making: Nonspecific right hip pain.  Inflammatory markers are elevated.  White count mildly elevated.  No fever, when checked here.  Have suggested severe arthritis but with synovitis possibly consistent with infection.  Patient had right hip aspiration under fluoroscopy by radiologist.  Gram stain returned, with no organisms.  Culture pending.   Plan: Referral to orthopedics.  Prescriptions for Percocet and prednisone sent.  Discharged with instructions.  Medical screening examination/treatment/procedure(s) were conducted as a shared visit with non-physician practitioner(s) and myself.  I personally evaluated the patient during the encounter    Daleen Bo, MD 06/17/19 1710

## 2019-06-16 NOTE — ED Notes (Signed)
Patient transported to X-ray 

## 2019-06-16 NOTE — ED Notes (Signed)
Pt given crutches for home use

## 2019-06-16 NOTE — ED Triage Notes (Signed)
Pt reports chronic pain in r lower back, hip, and leg.  Reports pain has worsened over the past week.  Denies injury.

## 2019-06-16 NOTE — ED Notes (Signed)
Dr. Wentz at bedside. 

## 2019-06-16 NOTE — Procedures (Signed)
Preprocedure Dx: RIGHT hip pain, abnormal MRI, suspected septic arthritis RIGHT hip Postprocedure Dx: RIGHT hip pain, abnormal MRI, suspected septic arthritis RIGHT hip Procedure  Fluoroscopically guided RIGHT hip joint aspiration Radiologist:  Thornton Papas Anesthesia:  5 ml of 1% lidocaine Injectate:  5 mL sterile saline as lavage fluid Aspirate: < 1 mL of browninsh slightly blood tinged cloudy fluid Fluoro time:  0 minutes 30 seconds EBL:   < 1 ml Complications: None

## 2019-06-16 NOTE — ED Notes (Signed)
Pt called out wanting an update about his status; Dr. Eulis Foster informed and pt still waiting on lab results; pt is requesting something to eat and something for pain; pt informed he is not able to eat at this time due to possible surgery; waiting for orders for pain medication

## 2019-06-16 NOTE — ED Notes (Signed)
Pt transported to xray for Fluoro procedure.

## 2019-06-16 NOTE — ED Notes (Signed)
Pt requested food, but says he cannot eat right now because of pain. Dr Eulis Foster aware.

## 2019-06-16 NOTE — ED Provider Notes (Addendum)
[facility] EMERGENCY DEPARTMENT Provider Note   CSN: [id number] Arrival date & time: 06/16/19  1114    History   Chief Complaint Chief Complaint  Patient presents with   Back Pain    HPI Christopher [name] is a 53 y.o. male with a history of heroin addiction presenting with severe right hip pain.  He reports chronic low back and hip pain, stating he was told 5 years ago he needs a hip replacement surgery, but with severe worsened pain over the past week with inability to weight bear.  He denies new injury, also denies fevers, chills, swelling, denies abdominal or groin pain and no groin or scrotal pain. He cannot flex or rotate the right hip without severe pain.  He last injected heroin 2 days ago, states he uses heroin because no one will treat his pain.  He does not have a pcp. He reports seeing an orthopedist about 5 years ago regarding the hip issue, but lack of insurance has prevented followup care.     The history is provided by the patient.    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   Abscess of arm, right 12/10/2014   Cellulitis of right arm 12/10/2014   Hyperglycemia 12/10/2014   IV drug user 12/10/2014   Hyponatremia 12/10/2014    Past Surgical History:  Procedure Laterality Date   ANKLE FRACTURE SURGERY Left 2012   FRACTURE SURGERY     I&D EXTREMITY Right 12/10/2014   Procedure: IRRIGATION AND DEBRIDEMENT EXTREMITY;  Surgeon: Charlotte Crumb, MD;  Location: [place];  Service: Orthopedics;  Laterality: Right;   I&D EXTREMITY Right 12/12/2014   Procedure: IRRIGATION AND DEBRIDEMENT EXTREMITY;  Surgeon: Charlotte Crumb, MD;  Location: [place];  Service: Orthopedics;  Laterality: Right;  forearm   INCISION AND DRAINAGE ABSCESS Right 12/10/2014   Proximal forearm dorsal deep abscess.         Home Medications    Prior to Admission medications   Medication Sig Start Date End Date Taking? Authorizing Provider  cephALEXin (KEFLEX) 500 MG capsule Take 1 capsule (500 mg total) 3 (three) times daily by mouth. Patient not taking: Reported on 06/16/2019 10/21/17   Orlie Dakin, MD  oxyCODONE-acetaminophen (PERCOCET) 5-325 MG tablet Take 1 tablet by mouth every 4 (four) hours as needed for severe pain. 06/16/19   Daleen Bo, MD  predniSONE (DELTASONE) 20 MG tablet Take 1 tablet (20 mg total) by mouth 2 (two) times daily. 06/16/19   Daleen Bo, MD    Family History Family History  Problem Relation Age of Onset   Liver disease Mother    Heart disease Father     Social History Social History   Tobacco Use   Smoking status: Current Some Day Smoker    Packs/day: 0.50    Years: 30.00    Pack years: 15.00    Types: Cigarettes   Smokeless tobacco: Never Used  Substance Use Topics   Alcohol use: Yes    Comment: 12/10/2014 "used to drink considerable; hardly drink at all anymore"   Drug use: Yes    Comment: "IV molly or meth" heroin     Allergies   Patient has no known allergies.   Review of Systems Review of Systems  Constitutional: Negative for chills and fever.  Musculoskeletal: Positive for  arthralgias. Negative for joint swelling and myalgias.  Skin: Negative for color change, rash and wound.  Neurological: Negative for weakness and numbness.     Physical Exam Updated Vital Signs BP (!) 155/98    Pulse 84    Temp 98 F (36.7 C) (Oral)    Resp 18    Ht 5\' 10"  (1.778 m)    SpO2 100%    BMI 26.40 kg/m   Physical Exam Constitutional:      Appearance: He is well-developed.  HENT:     Head: Atraumatic.  Neck:     Musculoskeletal: Normal range of motion.  Cardiovascular:     Comments: Pulses equal bilaterally Musculoskeletal:        General: Tenderness present. No swelling.     Right hip: He exhibits decreased range of motion,  tenderness and bony tenderness. He exhibits no swelling.     Comments: ttp right anterior hip through greater trochanter. No edema, no erythema.  Pt refuses active and passive ROM.  No crepitus to palpation.  Thigh musculature nontender and soft.     Skin:    General: Skin is warm and dry.     Comments: Suspected track mark right mid forearm.   Neurological:     Mental Status: He is alert.     Sensory: No sensory deficit.     Deep Tendon Reflexes: Reflexes normal.      ED Treatments / Results  Labs (all labs ordered are listed, but only abnormal results are displayed) Labs Reviewed  URINALYSIS, ROUTINE W REFLEX MICROSCOPIC - Abnormal; Notable for the following components:      Result Value   Hgb urine dipstick SMALL (*)    Bacteria, UA RARE (*)    All other components within normal limits  CBC WITH DIFFERENTIAL/PLATELET - Abnormal; Notable for the following components:   WBC 12.6 (*)    Hemoglobin 12.3 (*)    HCT 37.3 (*)    Platelets 510 (*)    Neutro Abs 9.6 (*)    All other components within normal limits  BASIC METABOLIC PANEL - Abnormal; Notable for the following components:   Sodium 134 (*)    Glucose, Bld 100 (*)    All other components within normal limits  SEDIMENTATION RATE - Abnormal; Notable for the following components:   Sed Rate 79 (*)    All other components within normal limits  C-REACTIVE PROTEIN - Abnormal; Notable for the following components:   CRP 8.5 (*)    All other components within normal limits  GRAM STAIN  RAPID URINE DRUG SCREEN, HOSP PERFORMED    EKG   Radiology Dg Lumbar Spine Complete  Result Date: 06/16/2019 CLINICAL DATA:  Back pain over the past week. EXAM: LUMBAR SPINE - COMPLETE 4+ VIEW COMPARISON:  None. FINDINGS: Normal alignment of the lumbar vertebral bodies. Disc spaces are maintained. Mild degenerative changes with marginal osteophytes. The facets are normally aligned. No pars defects. The visualized bony pelvis is intact.  The SI joints appear normal. Age advanced vascular calcifications involving the aorta without obvious aneurysm. IMPRESSION: Normal alignment and no acute bony findings. Electronically Signed   By: Rudie MeyerP.  Gallerani M.D.   On: 06/16/2019 12:56   Mr Hip Right W Wo Contrast  Result Date: 06/16/2019 CLINICAL DATA:  Chronic right low back pain, hip pain and leg pain which is worsening over the past week EXAM: MRI OF THE RIGHT HIP WITHOUT AND WITH CONTRAST TECHNIQUE: Multiplanar, multisequence MR imaging was performed both  before and after administration of intravenous contrast. CONTRAST:  7 mL Gadavist COMPARISON:  None. FINDINGS: Bones: No left hip fracture, dislocation or avascular necrosis. No right hip fracture or dislocation. Severe right hip joint space narrowing with a bone-on-bone appearance, severe subchondral cystic changes, and marginal osteophytes with severe subchondral marrow edema. No avascular necrosis. Pericapsular soft tissue edema. Normal sacrum and sacroiliac joints. No SI joint widening or erosive changes. Articular cartilage and labrum Articular cartilage: Extensive full-thickness cartilage loss of the right femoral head and acetabulum. In Labrum:  Maceration of the right labrum. Joint or bursal effusion Joint effusion: Large right hip joint effusion with severe synovitis. No left hip joint effusion. No SI joint effusion. Bursae:  No bursa formation. Muscles and tendons Flexors: Normal. Extensors: Normal. Abductors: Normal. Adductors: Normal. Gluteals: Normal. Hamstrings: Normal. Other findings Miscellaneous: No pelvic free fluid. No fluid collection or hematoma. No inguinal lymphadenopathy. No inguinal hernia. IMPRESSION: 1. Right hips findings most consistent with severe advanced osteoarthritis with severe subchondral marrow edema and cystic changes. Large right hip joint effusion with severe synovitis likely inflammatory secondary to advanced OA, but given the patient's elevated WBC and  pericapsular edema septic arthritis cannot be completely excluded. Arthrocentesis of the right hip is recommended. Electronically Signed   By: Elige KoHetal  Patel   On: 06/16/2019 16:15   Dg Fluoro Guided Needle Plc Aspiration/injection Loc  Result Date: 06/16/2019 CLINICAL DATA:  Severe RIGHT hip pain, history of heroin abuse, abnormal MRI demonstrating joint effusion, marrow edema, and no reaction at the RIGHT hip joint question septic arthritis EXAM: RIGHT HIP ASPIRATION UNDER FLUOROSCOPY FLUOROSCOPY TIME:  Fluoroscopy Time:  0 minutes 30 seconds Radiation Exposure Index (if provided by the fluoroscopic device): 5.7 mGy Number of Acquired Spot Images: 2 PROCEDURE: Procedure, risks, benefits and alternatives explained to the patient. Patient's questions answered. Written informed consent obtained. Timeout protocol followed. RIGHT hip joint localized by fluoroscopy. Skin prepped and draped in usual sterile fashion. Skin and soft tissues anesthetized with 5 mL of 1% lidocaine. Under fluoroscopic guidance, 20 gauge spinal needle was advanced into RIGHT hip joint. Initially, no fluid could be aspirated from the joint. 5 mL of sterile saline was injected into the joint as lavaged. Following, less than 1 cc of brownish cloudy fluid with slight blood tinging was able to be aspirated from the hip joint. Procedure tolerated well by patient without immediate complication. Patient had significant pain with positioning. IMPRESSION: Technically successful RIGHT hip aspiration under fluoroscopy. Electronically Signed   By: Ulyses SouthwardMark  Boles M.D.   On: 06/16/2019 18:42   Dg Hip Unilat W Or W/o Pelvis 2-3 Views Right  Result Date: 06/16/2019 CLINICAL DATA:  Chronic pain. EXAM: DG HIP (WITH OR WITHOUT PELVIS) 2-3V RIGHT COMPARISON:  01/20/2015. FINDINGS: Diffuse osteopenia. Degenerative changes noted of both hips. Degenerative changes about the right hip are particularly severe with prominent subchondral cysts noted. Erosive changes  about the right femoral head and acetabulum and an erosive inflammatory or infectious arthropathy cannot be excluded. Flattening of the right femoral head is also noted. Avascular necrosis cannot be excluded. Pelvic calcifications consistent with phleboliths. IMPRESSION: 1. Degenerative changes noted both hips. Degenerative changes noted about the right hip are particularly severe with prominent subchondral cysts noted. Erosive changes about the right femoral head and right acetabulum cannot be excluded and an erosive inflammatory or infectious arthropathy cannot be excluded. 2. Flattening of the right femoral head. Avascular necrosis cannot be excluded. MRI of the right hip may prove useful for  further evaluation. Electronically Signed   By: Maisie Fushomas  Register   On: 06/16/2019 12:56    Procedures Procedures (including critical care time)  Medications Ordered in ED Medications  ketorolac (TORADOL) 30 MG/ML injection 30 mg (30 mg Intramuscular Given 06/16/19 1201)  morphine 4 MG/ML injection 4 mg (4 mg Intravenous Given 06/16/19 1401)  ondansetron (ZOFRAN) injection 4 mg (4 mg Intravenous Given 06/16/19 1401)  gadobutrol (GADAVIST) 1 MMOL/ML injection 7 mL (7 mLs Intravenous Contrast Given 06/16/19 1511)  HYDROmorphone (DILAUDID) injection 1 mg (1 mg Intravenous Given 06/16/19 1606)  lidocaine (PF) (XYLOCAINE) 1 % injection (5 mLs Injection Given 06/16/19 1820)  povidone-iodine (BETADINE) 10 % external solution ( Other Given 06/16/19 1822)  sodium chloride flush 0.9 % injection ( Other Given 06/16/19 1822)  HYDROmorphone (DILAUDID) injection 1 mg (1 mg Intravenous Given 06/16/19 1828)  HYDROmorphone (DILAUDID) injection 1 mg (1 mg Intravenous Given 06/16/19 2107)  predniSONE (DELTASONE) tablet 60 mg (60 mg Oral Given 06/16/19 2137)  oxyCODONE-acetaminophen (PERCOCET/ROXICET) 5-325 MG per tablet 1 tablet (1 tablet Oral Given 06/16/19 2137)     Initial Impression / Assessment and Plan / ED Course  I have  reviewed the triage vital signs and the nursing notes.  Pertinent labs & imaging results that were available during my care of the patient were reviewed by me and considered in my medical decision making (see chart for details).  Clinical Course as of Jun 16 1714  Mon Jun 16, 2019  2105 Platelets(!): 510 [EW]  2105 DG Lumbar Spine Complete [EW]    Clinical Course User Index [EW] Mancel BaleWentz, Elliott, MD       Labs and imaging reviewed, advanced OA vs septic joint with large joint effusion.  Arthrocentesis recommended.  Pt discussed with Dr. Janee Mornhompson who advised interventional radiology for aspiration for definitive diagnosis.  Discussed with Dr. Tyron RussellBoles who can do this procedure now. Discussed with patient who is agreeable, order placed.  Discussed with Dr. Effie ShyWentz who assumes care.    Dr. Janee Mornhompson requests notification asap if we determine infection and will need a wash out. Avoid lovenox and consider keeping npo if surgery indicated.    Final Clinical Impressions(s) / ED Diagnoses   Final diagnoses:  Hip pain  Arthritis of right hip    ED Discharge Orders         Ordered    oxyCODONE-acetaminophen (PERCOCET) 5-325 MG tablet  Every 4 hours PRN     06/16/19 2115    predniSONE (DELTASONE) 20 MG tablet  2 times daily     06/16/19 2115           Burgess Amordol, Raeley Gilmore, PA-C 06/16/19 1731    Burgess AmorIdol, Harpreet Signore, PA-C 06/16/19 1844    Burgess AmorIdol, Nelwyn Hebdon, PA-C 06/17/19 1748    Pricilla LovelessGoldston, Scott, MD 06/19/19 1650

## 2019-06-16 NOTE — ED Notes (Signed)
Patient transported to MRI 

## 2019-06-16 NOTE — ED Notes (Signed)
Pt returned from radiology.

## 2019-06-16 NOTE — ED Notes (Signed)
Pt aware a urine specimen is needed. Urinal left at bedside.  

## 2019-07-06 ENCOUNTER — Encounter (HOSPITAL_COMMUNITY): Payer: Self-pay | Admitting: *Deleted

## 2019-07-06 ENCOUNTER — Emergency Department (HOSPITAL_COMMUNITY)
Admission: EM | Admit: 2019-07-06 | Discharge: 2019-07-06 | Disposition: A | Discharge status: Home / Self Care | Payer: Self-pay | Attending: Emergency Medicine | Admitting: Emergency Medicine

## 2019-07-06 ENCOUNTER — Other Ambulatory Visit: Payer: Self-pay

## 2019-07-06 ENCOUNTER — Emergency Department (HOSPITAL_COMMUNITY): Payer: Self-pay

## 2019-07-06 DIAGNOSIS — M25551 Pain in right hip: Secondary | ICD-10-CM

## 2019-07-06 DIAGNOSIS — R52 Pain, unspecified: Secondary | ICD-10-CM

## 2019-07-06 DIAGNOSIS — F17210 Nicotine dependence, cigarettes, uncomplicated: Secondary | ICD-10-CM | POA: Insufficient documentation

## 2019-07-06 DIAGNOSIS — Z79899 Other long term (current) drug therapy: Secondary | ICD-10-CM | POA: Insufficient documentation

## 2019-07-06 MED ORDER — MELOXICAM 15 MG PO TABS: 15.0000 mg | ORAL_TABLET | Freq: Every day | ORAL | 1 refills | Status: AC

## 2019-07-06 MED ORDER — LIDOCAINE 5 % EX PTCH: 1.0000 | MEDICATED_PATCH | Freq: Two times a day (BID) | CUTANEOUS | 0 refills | Status: AC

## 2019-07-06 MED ORDER — KETOROLAC TROMETHAMINE 60 MG/2ML IM SOLN
60.0000 mg | Freq: Once | INTRAMUSCULAR | Status: AC
  Administered 2019-07-06: 60 mg via INTRAMUSCULAR
  Filled 2019-07-06: qty 2

## 2019-07-06 NOTE — ED Triage Notes (Signed)
Patient slipped in shower without actually falling, patient had caught himself before hitting the floor, stating he heard and felt a "pop" in right hip.

## 2019-07-06 NOTE — ED Notes (Signed)
Upon discharge pt ripped gown off and threw it in the floor. Pt stating "this is bullshit." Pt refused D/C vital signs.

## 2019-07-06 NOTE — ED Provider Notes (Addendum)
[facility] EMERGENCY DEPARTMENT Provider Note   CSN: [id number] Arrival date & time: 07/06/19  1833     History   Chief Complaint Chief Complaint  Patient presents with  . Hip Pain    right    HPI Christopher [name] is a 53 y.o. male.     The history is provided by the patient. No language interpreter was used.  Hip Pain This is a recurrent problem. Episode onset: years. The problem occurs constantly. The problem has been gradually worsening. Nothing aggravates the symptoms. Nothing relieves the symptoms. He has tried nothing for the symptoms. The treatment provided no relief.  Pt complains of pain in his right hip.  Pt reports he heard a loud pop tonight and had severe pain.  Pt reports he has been told he needs a hip replacement.   Past Medical History:  Diagnosis Date  . Heroin addiction (HCC)   . IVDU (intravenous drug user)   . Polysubstance abuse [facility](HCC)     Patient Active Problem List   Diagnosis Date Noted  . 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 Abscess of arm, right 12/10/2014  . Cellulitis of right arm 12/10/2014  . Hyperglycemia 12/10/2014  . IV drug user 12/10/2014  . Hyponatremia 12/10/2014    Past Surgical History:  Procedure Laterality Date  . ANKLE FRACTURE SURGERY Left 2012  . FRACTURE SURGERY    . I&D EXTREMITY Right 12/10/2014   Procedure: IRRIGATION AND DEBRIDEMENT EXTREMITY;  Surgeon: Dairl PonderMatthew Weingold, MD;  [facility];  Service: Orthopedics;  Laterality: Right;  . I&D EXTREMITY Right 12/12/2014   Procedure: IRRIGATION AND DEBRIDEMENT EXTREMITY;  Surgeon: Dairl PonderMatthew Weingold, MD;  Location: [facility] OR;  Service: Orthopedics;  Laterality: Right;  forearm  . INCISION AND DRAINAGE ABSCESS Right 12/10/2014   Proximal forearm dorsal deep abscess.        Home Medications    Prior to Admission medications   Medication Sig Start Date End Date Taking? Authorizing Provider  cephALEXin (KEFLEX) 500 MG capsule Take 1 capsule (500 mg total) 3 (three) times daily  by mouth. Patient not taking: Reported on 06/16/2019 10/21/17   Doug SouJacubowitz, Sam, MD  oxyCODONE-acetaminophen (PERCOCET) 5-325 MG tablet Take 1 tablet by mouth every 4 (four) hours as needed for severe pain. 06/16/19   Mancel BaleWentz, Elliott, MD  predniSONE (DELTASONE) 20 MG tablet Take 1 tablet (20 mg total) by mouth 2 (two) times daily. 06/16/19   Mancel BaleWentz, Elliott, MD    Family History Family History  Problem Relation Age of Onset  . Liver disease Mother   . Heart disease Father     Social History Social History   Tobacco Use  . Smoking status: Current Some Day Smoker    Packs/day: 0.50    Years: 30.00    Pack years: 15.00    Types: Cigarettes  . Smokeless tobacco: Never Used  Substance Use Topics  . Alcohol use: Yes    Comment: 12/10/2014 "used to drink considerable; hardly drink at all anymore"  . Drug use: Yes    Comment: "IV molly or meth" heroin     Allergies   Patient has no known allergies.   Review of Systems Review of Systems  All other systems reviewed and are negative.    Physical Exam Updated Vital Signs BP 125/89   Pulse 75   Temp 97.6 F (36.4 C) (Oral)   Resp 20   Ht 5\' 10"  (1.778 m)   Wt 68 kg   SpO2 98%   BMI 21.52  kg/m   Physical Exam Vitals signs reviewed.  HENT:     Head: Normocephalic.  Cardiovascular:     Rate and Rhythm: Normal rate.  Pulmonary:     Effort: Pulmonary effort is normal.  Abdominal:     General: Abdomen is flat.  Musculoskeletal:        General: Tenderness present. No deformity.     Comments: Tender right hip,   Skin:    General: Skin is warm.  Neurological:     General: No focal deficit present.     Mental Status: He is alert.  Psychiatric:        Mood and Affect: Mood normal.      ED Treatments / Results  Labs (all labs ordered are listed, but only abnormal results are displayed) Labs Reviewed - No data to display  EKG None  Radiology Dg Hip Unilat With Pelvis 1v Right  Result Date: 07/06/2019 CLINICAL  DATA:  53 year old male with fall in the shower and right hip pain. EXAM: DG HIP (WITH OR WITHOUT PELVIS) 1V RIGHT COMPARISON:  Right hip radiograph dated 06/16/2019 FINDINGS: There is no acute fracture or dislocation. Severe arthritic changes of the right hip with superior migration of the male within the acetabulum and loss of joint space superiorly. There is subchondral cyst and cortical irregularity and sclerosis of the femoral head and acetabulum with associated flattening of the femoral head. This findings are similar to prior radiograph. The bones are osteopenic. The soft tissues are unremarkable. IMPRESSION: 1. No acute fracture or dislocation. 2. Severe arthritic changes of the right hip similar to prior radiograph. Electronically Signed   By: Anner Crete M.D.   On: 07/06/2019 20:49    Procedures Procedures (including critical care time)  Medications Ordered in ED Medications  ketorolac (TORADOL) injection 60 mg (60 mg Intramuscular Given 07/06/19 1911)     Initial Impression / Assessment and Plan / ED Course  I have reviewed the triage vital signs and the nursing notes.  Pertinent labs & imaging results that were available during my care of the patient were reviewed by me and considered in my medical decision making (see chart for details).        MDM  Pt given torodol Im.   Xray shows severe arthritis.  Pt had joint aspiration and MRi in June.  Aspiration was negative,  Mri showed severe arthritis  Pt advised he needs to see Orthopaedist.  He reports he is trying to get medicaid.  I will try treating pt with meloxicam and topical lidocaine.  Pt ask for percocet.  Pt has a history of iv drug use.   Final Clinical Impressions(s) / ED Diagnoses   Final diagnoses:  Pain  Right hip pain    ED Discharge Orders         Ordered    meloxicam (MOBIC) 15 MG tablet  Daily     07/06/19 2116    lidocaine (LIDODERM) 5 %  Every 12 hours     07/06/19 2121           Sidney Ace 07/06/19 2122    Fransico Meadow, PA-C 07/06/19 2134    Francine Graven, DO 07/10/19 1829

## 2019-10-19 DEATH — deceased

## 2019-11-21 ENCOUNTER — Ambulatory Visit: Payer: Self-pay | Attending: Family Medicine | Admitting: Family Medicine

## 2019-11-21 ENCOUNTER — Other Ambulatory Visit: Payer: Self-pay

## 2020-03-12 IMAGING — MR MRI OF THE RIGHT HIP WITHOUT AND WITH CONTRAST
5 of 9 series · 20 of 40 positions shown · IV contrast (7ml Gadavist)
Comparison: None.

CLINICAL DATA: Chronic right low back pain, hip pain and leg pain
which is worsening over the past week

EXAM:
MRI OF THE RIGHT HIP WITHOUT AND WITH CONTRAST
TECHNIQUE: Multiplanar, multisequence MR imaging was performed both before and
after administration of intravenous contrast.
CONTRAST:  7 mL Gadavist

[Series 2: T1 · coronal · 4.0mm · 0.55mm/px · 3 of 24 slices shown]
[im 1/24]
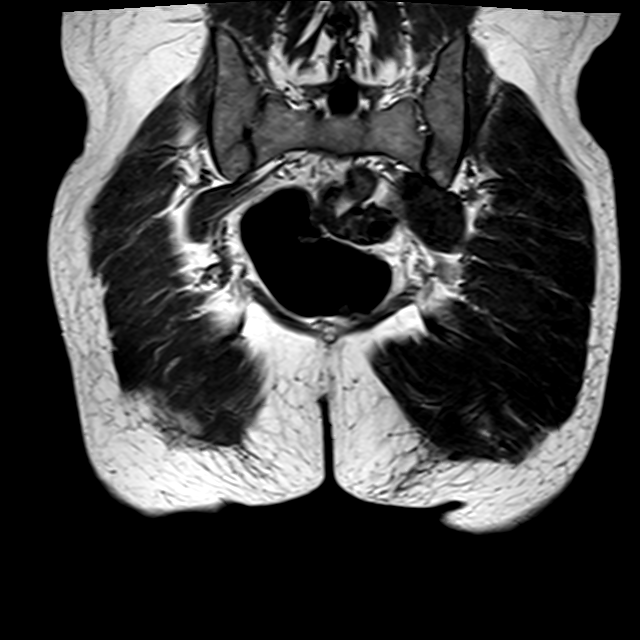
[im 8/24]
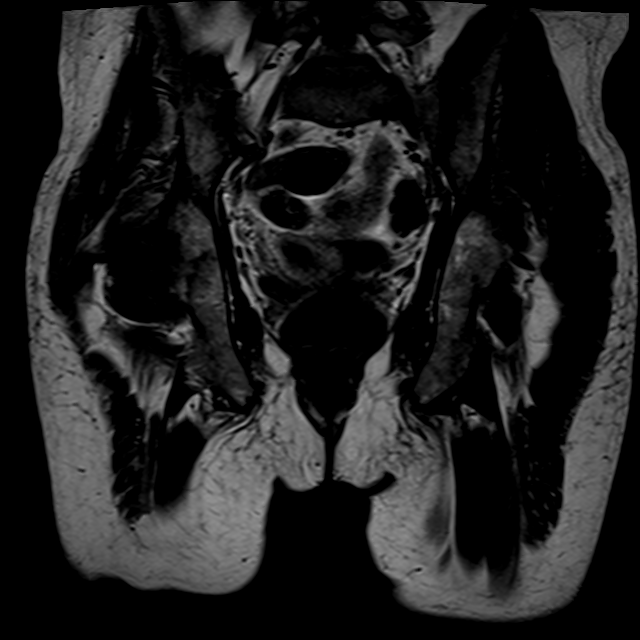
[im 16/24]
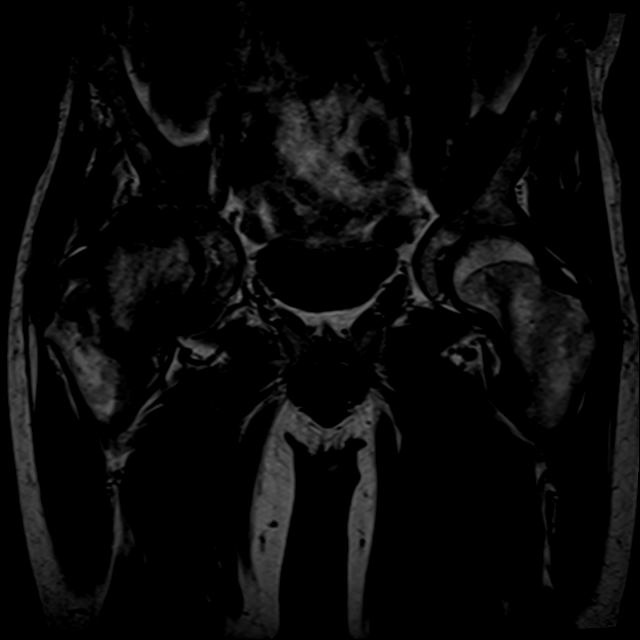

[Series 4: T2 fat-sat · axial · 4.0mm · 0.39mm/px · z∈[-36,+79]mm · 4 of 24 slices shown]
[im 1/24]
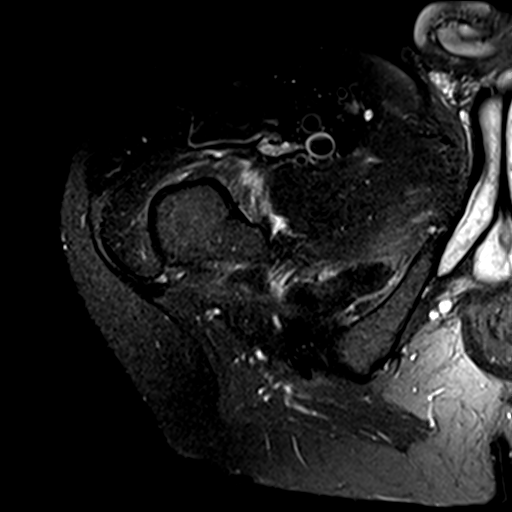
[im 8/24]
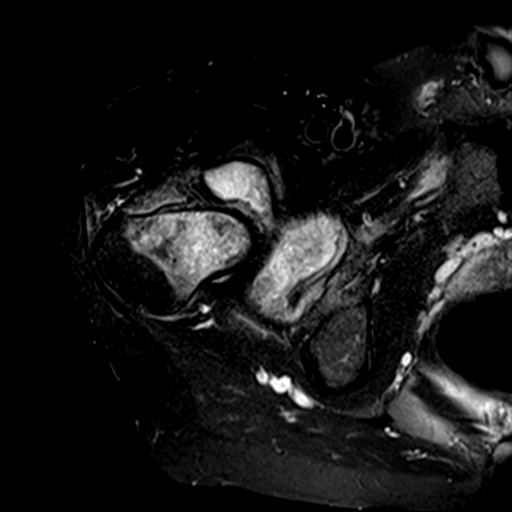
[im 16/24]
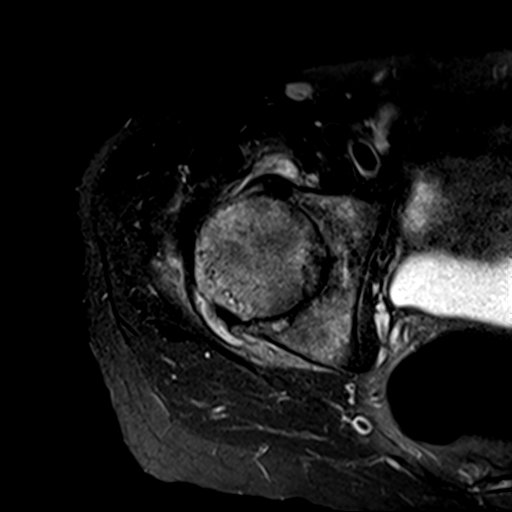
[im 24/24]
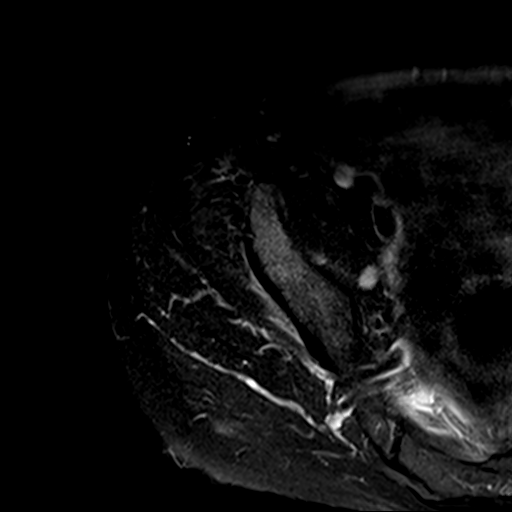

[Series 5: PD fat-sat · sagittal · 4.0mm · 0.35mm/px · 4 of 22 slices shown (1 of 3)]
[im 1/22]
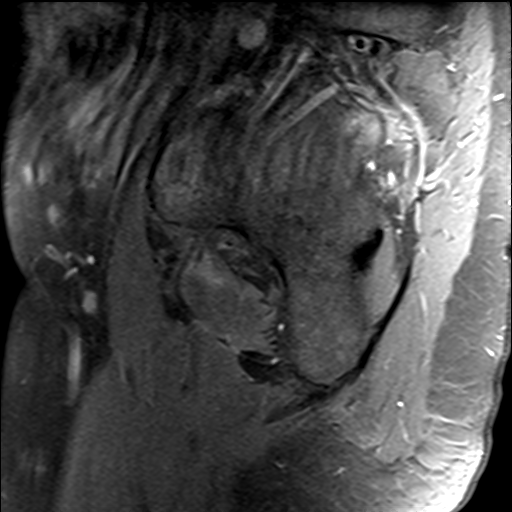
[im 8/22]
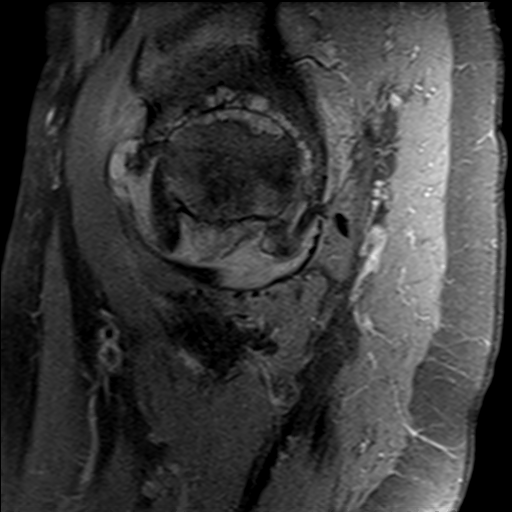
[im 15/22]
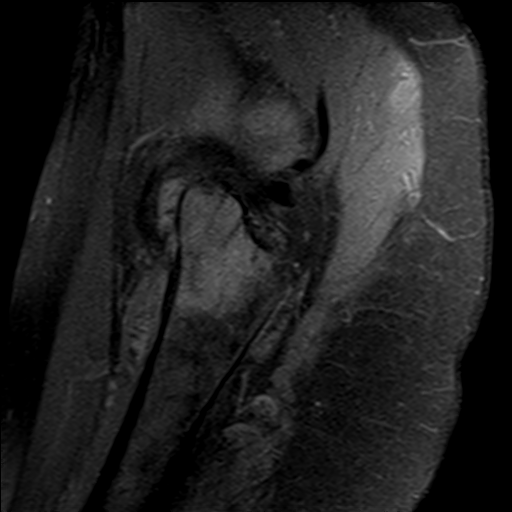
[im 22/22]
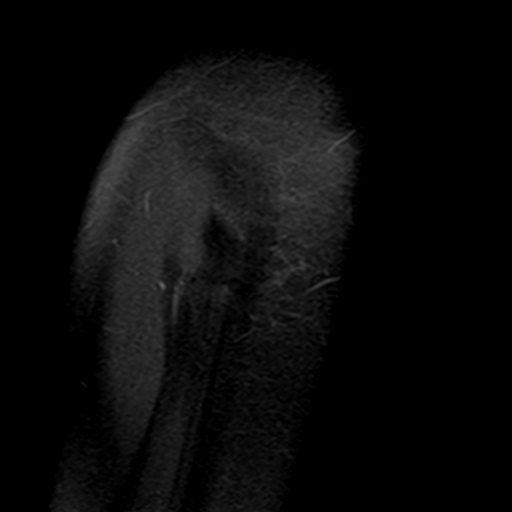

[Series 6: PD fat-sat · coronal · 4.0mm · 0.35mm/px · 4 of 22 slices shown (2 of 3)]
[im 1/22]
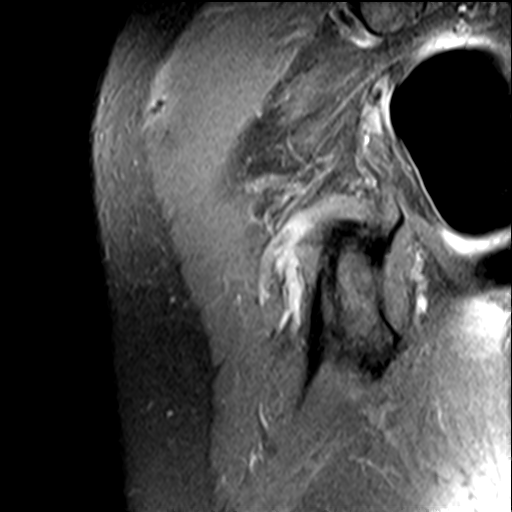
[im 8/22]
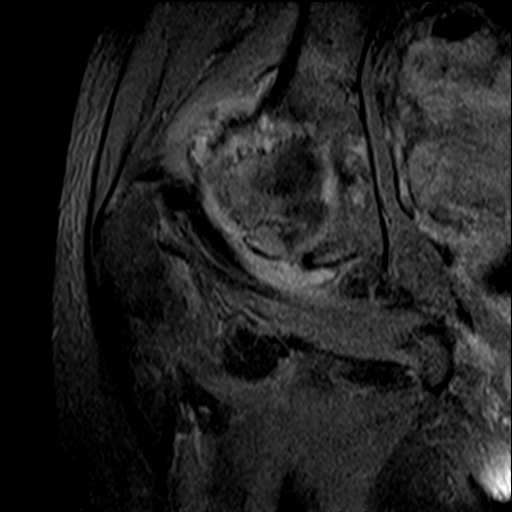
[im 15/22]
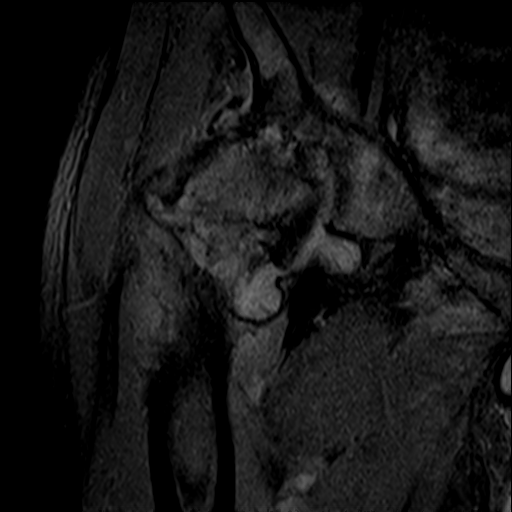
[im 22/22]
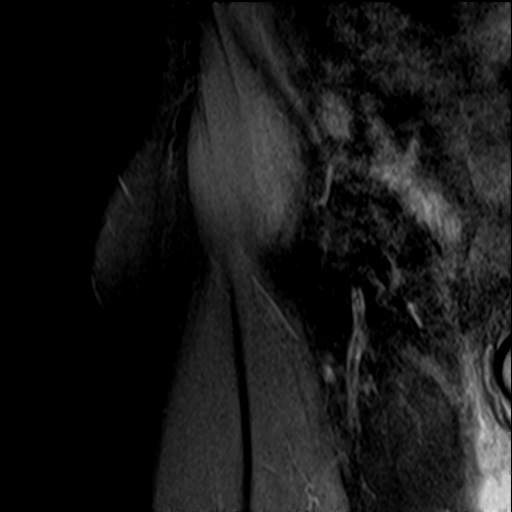

[Series 8: PD fat-sat · axial · 4.0mm · 1.09mm/px · z∈[-41,+69]mm · 5 of 24 slices shown (3 of 3)]
[im 1/24]
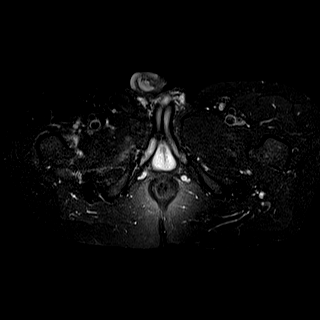
[im 6/24]
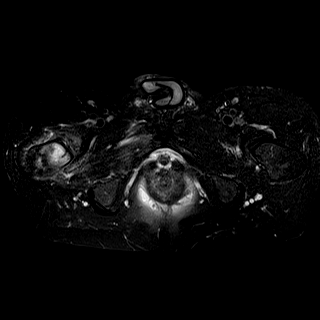
[im 12/24]
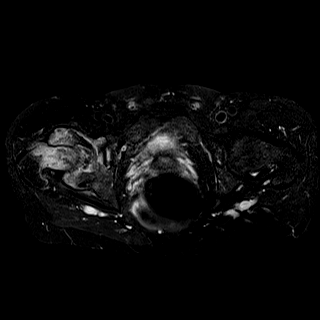
[im 18/24]
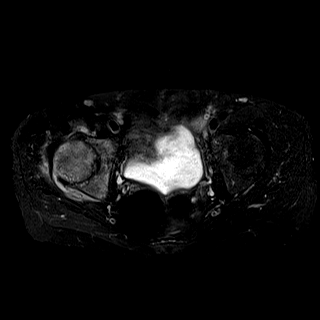
[im 24/24]
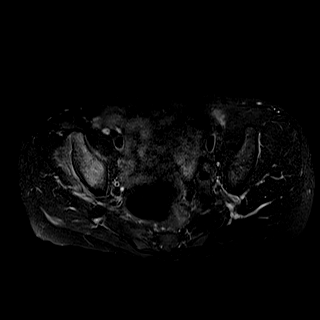

[20 of 40 positions shown; findings below may reference images not displayed]

FINDINGS: Bones:

No left hip fracture, dislocation or avascular necrosis.

No right hip fracture or dislocation. Severe right hip joint space
narrowing with a bone-on-bone appearance, severe subchondral cystic
changes, and marginal osteophytes with severe subchondral marrow
edema. No avascular necrosis. Pericapsular soft tissue edema.

Normal sacrum and sacroiliac joints. No SI joint widening or erosive
changes.

Articular cartilage and labrum

Articular cartilage: Extensive full-thickness cartilage loss of the
right femoral head and acetabulum. In

Labrum:  Maceration of the right labrum.

Joint or bursal effusion

Joint effusion: Large right hip joint effusion with severe
synovitis. No left hip joint effusion. No SI joint effusion.

Bursae:  No bursa formation.

Muscles and tendons

Flexors: Normal.

Extensors: Normal.

Abductors: Normal.

Adductors: Normal.

Gluteals: Normal.

Hamstrings: Normal.

Other findings

Miscellaneous: No pelvic free fluid. No fluid collection or
hematoma. No inguinal lymphadenopathy. No inguinal hernia.
IMPRESSION: 1. Right hips findings most consistent with severe advanced
osteoarthritis with severe subchondral marrow edema and cystic
changes. Large right hip joint effusion with severe synovitis likely
inflammatory secondary to advanced OA, but given the patient's
elevated WBC and pericapsular edema septic arthritis cannot be
completely excluded. Arthrocentesis of the right hip is recommended.

## 2020-04-01 IMAGING — DX DG HIP (WITH OR WITHOUT PELVIS) 1V RIGHT
3 series · 3 of 3 positions shown · non-contrast
Comparison: Right hip radiograph dated 06/16/2019

CLINICAL DATA: 53-year-old male with fall in the shower and right
hip pain.

EXAM:
DG HIP (WITH OR WITHOUT PELVIS) 1V RIGHT

[hip ap]
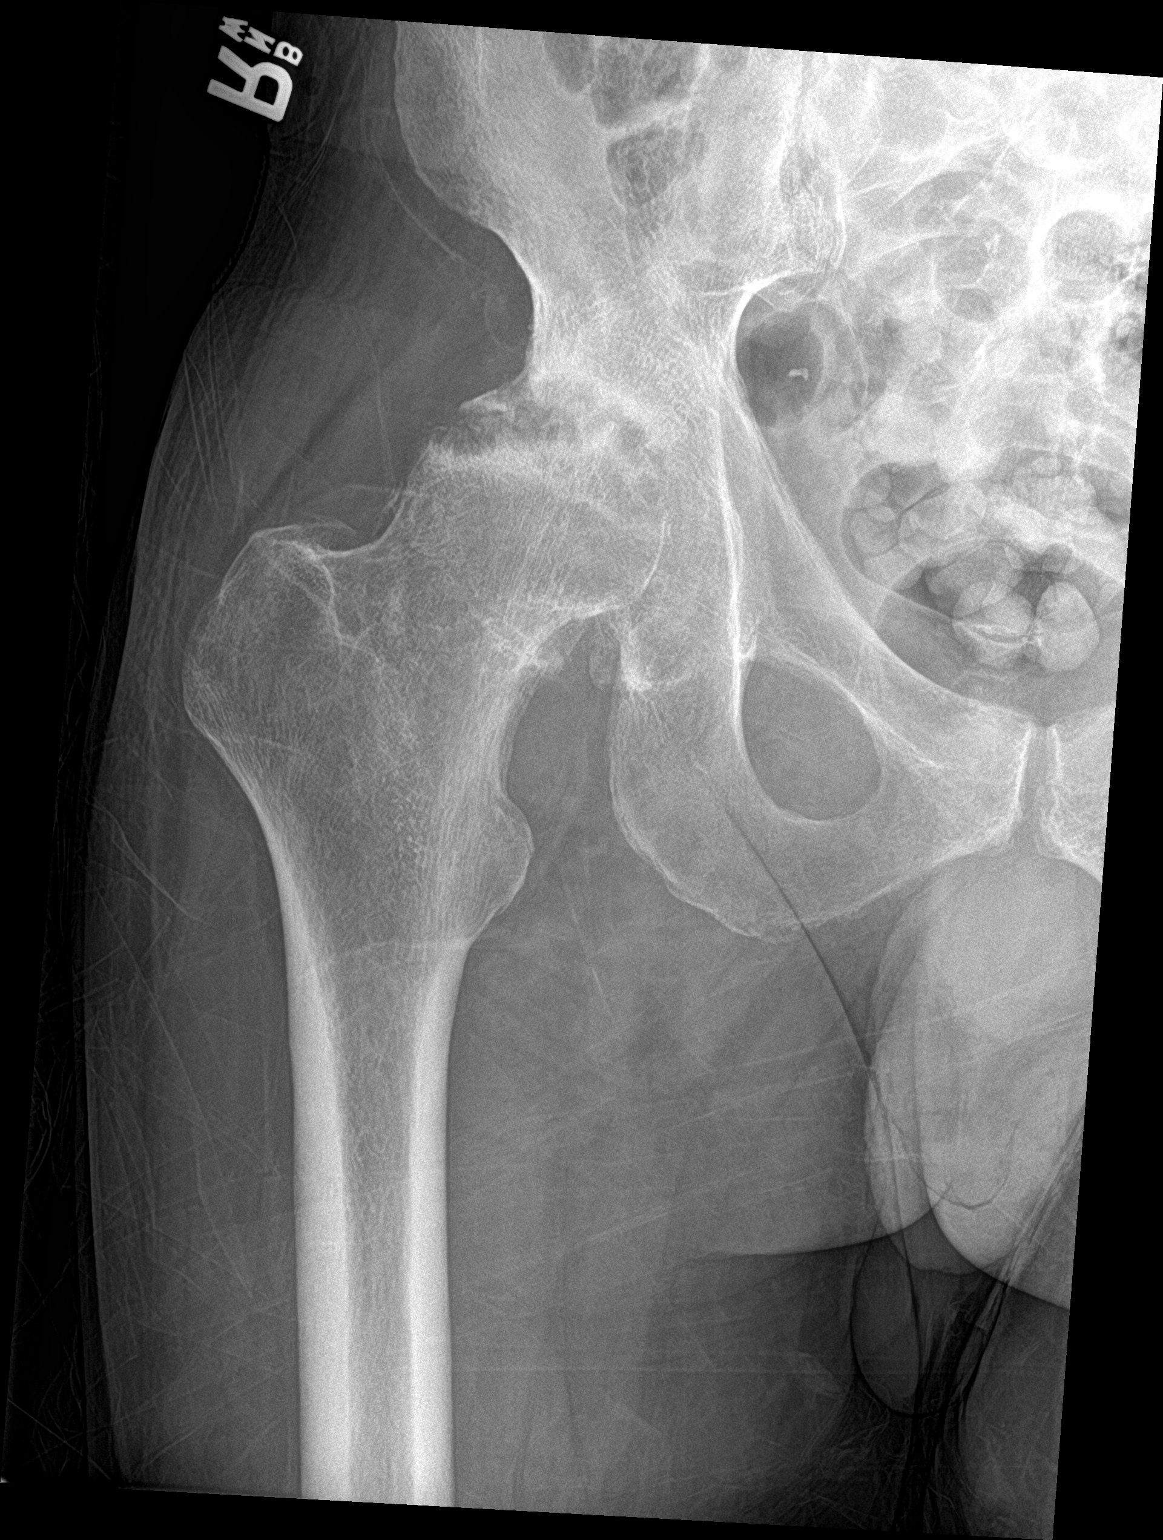

[hip lat]
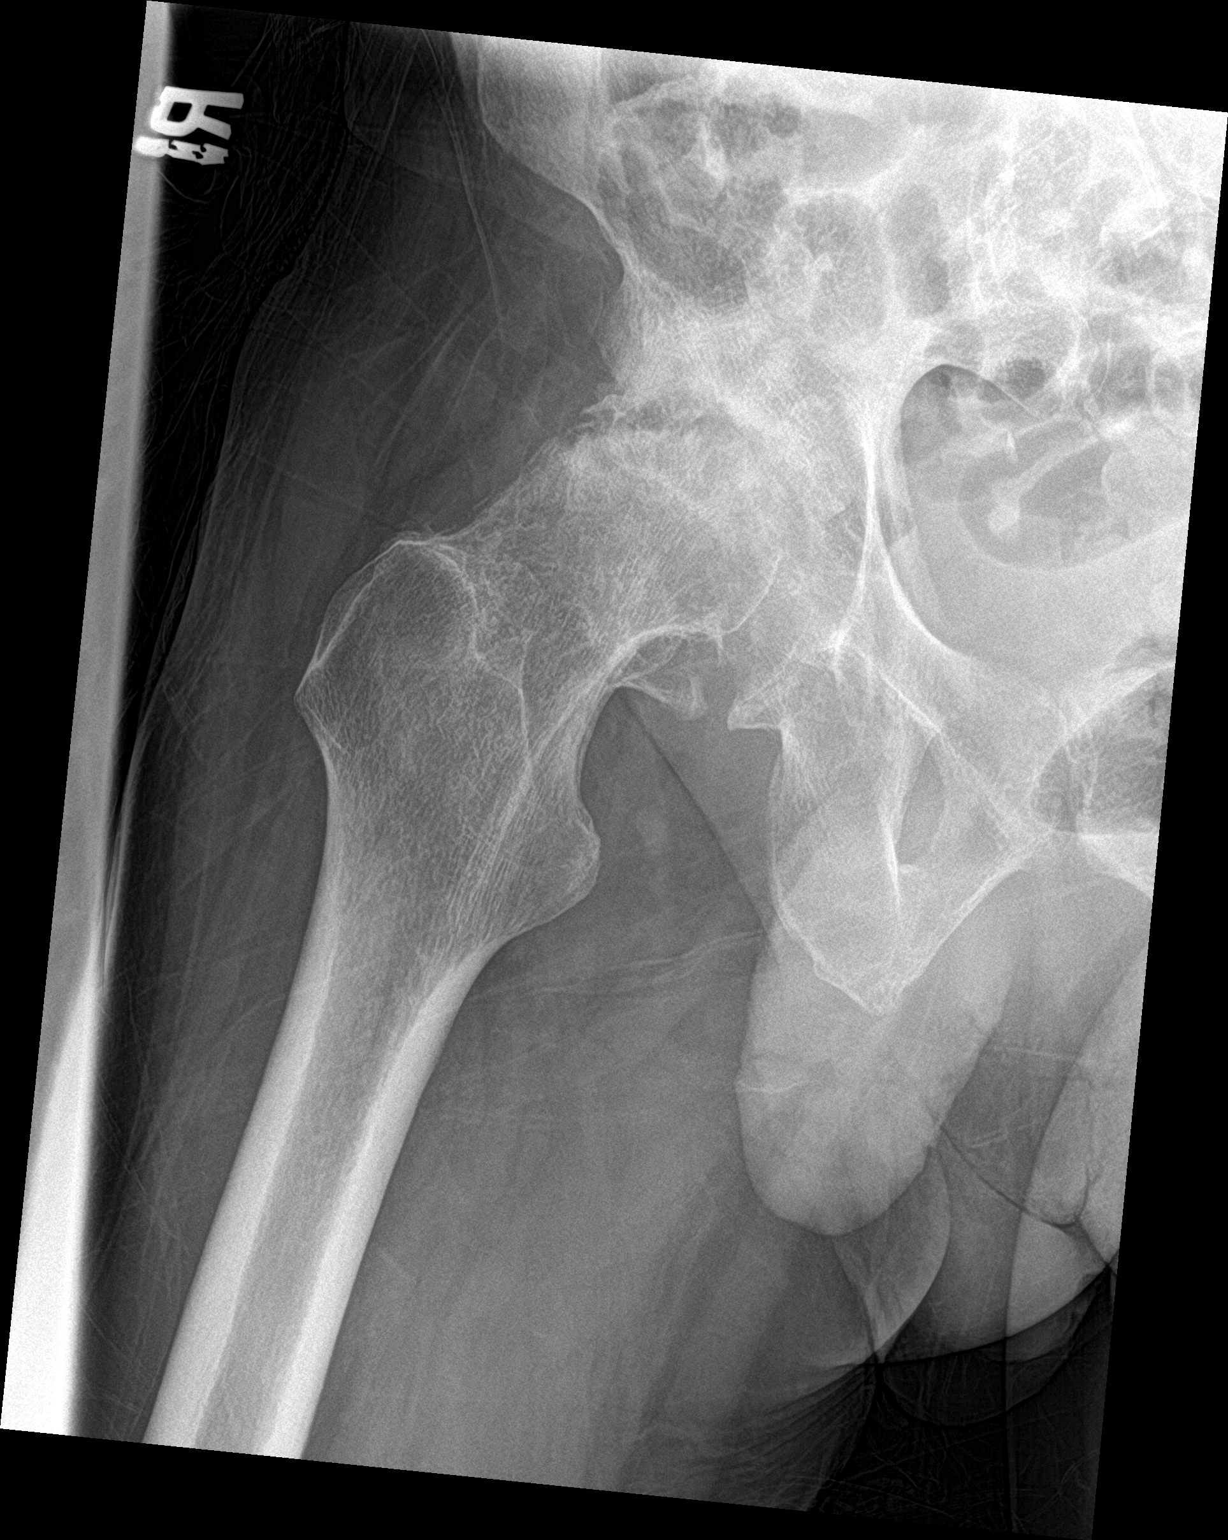

[hip lat xtable right]
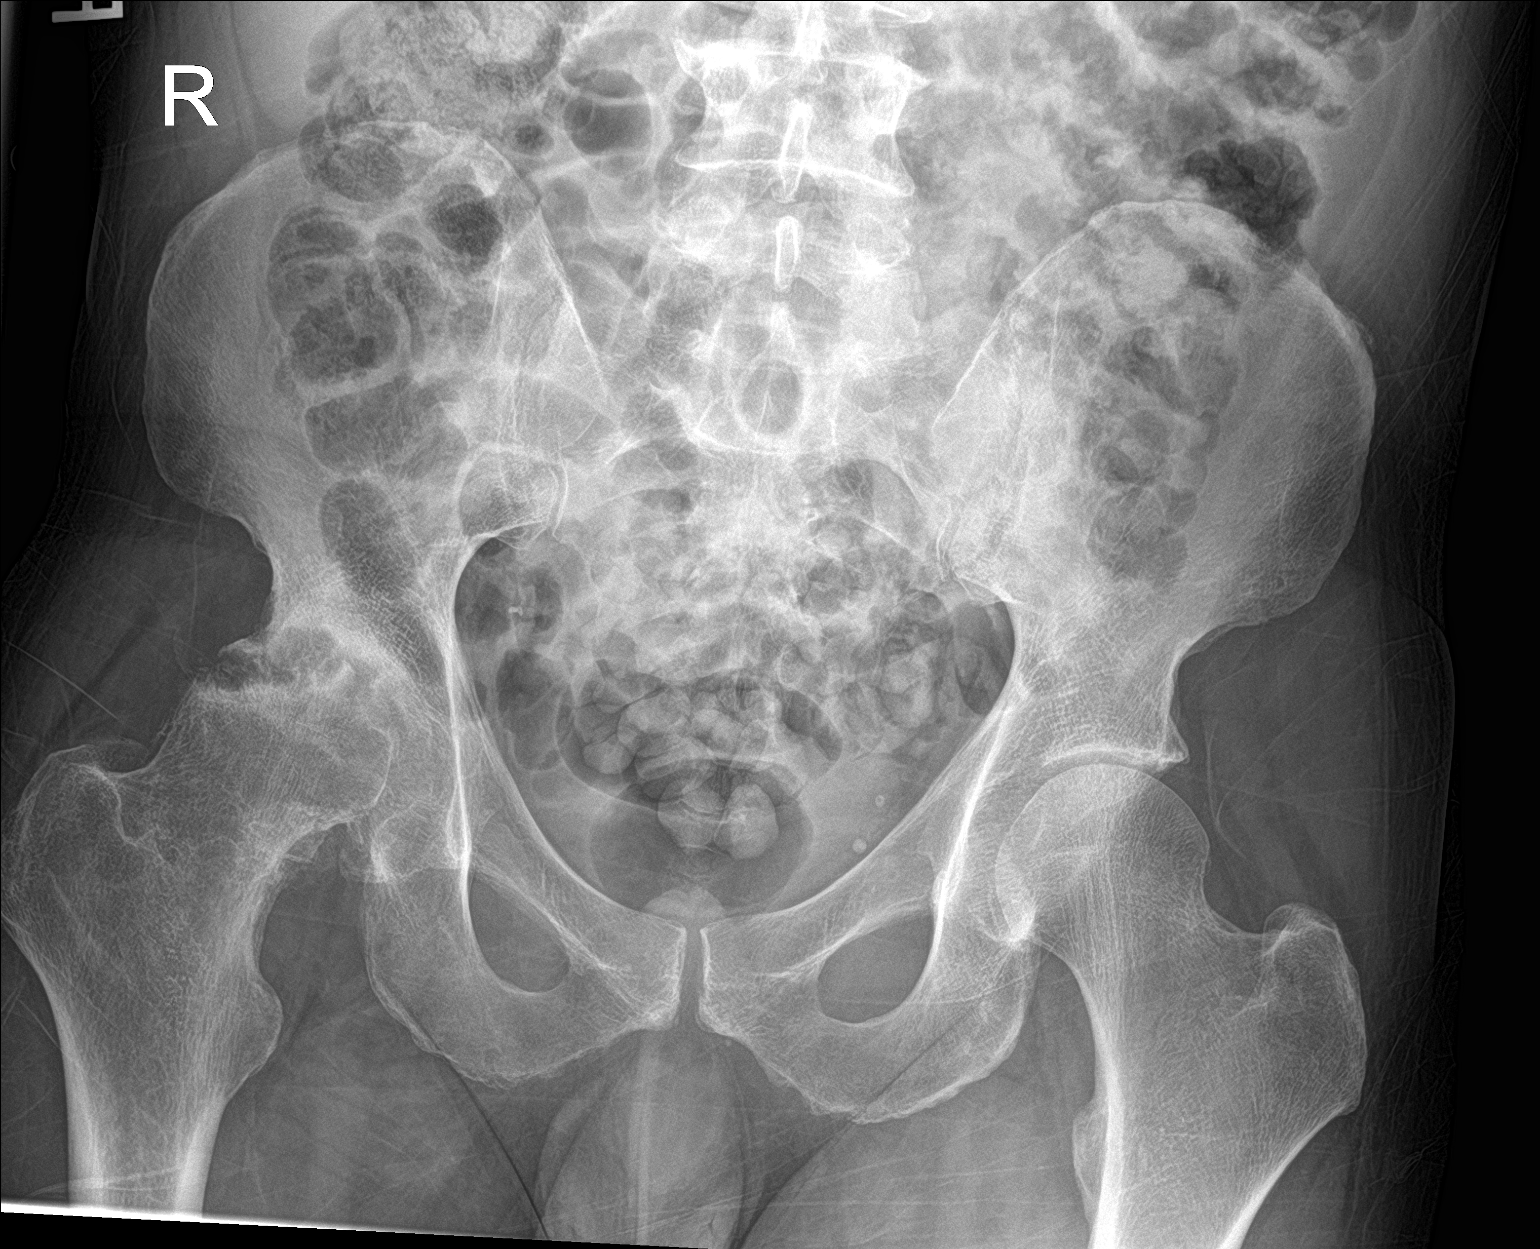

[3 of 3 positions shown; findings below may reference images not displayed]

FINDINGS: There is no acute fracture or dislocation. Severe arthritic changes
of the right hip with superior migration of the female within the
acetabulum and loss of joint space superiorly. There is subchondral
cyst and cortical irregularity and sclerosis of the femoral head and
acetabulum with associated flattening of the femoral head. This
findings are similar to prior radiograph. The bones are osteopenic.
The soft tissues are unremarkable.
IMPRESSION: 1. No acute fracture or dislocation.
2. Severe arthritic changes of the right hip similar to prior
radiograph.
